# Patient Record
Sex: Male | Born: 1975 | Hispanic: Yes | Marital: Married | Smoking: Never smoker
Health system: Southern US, Community
[De-identification: ages and names within clinical notes are randomized; demographics above are authoritative.]

## PROBLEM LIST (undated history)

## (undated) DIAGNOSIS — R7303 Prediabetes: Secondary | ICD-10-CM

## (undated) DIAGNOSIS — Z789 Other specified health status: Secondary | ICD-10-CM

## (undated) HISTORY — PX: NO PAST SURGERIES: SHX2092

## (undated) HISTORY — PX: LIPOMA EXCISION: SHX5283

---

## 2005-04-02 ENCOUNTER — Ambulatory Visit: Payer: Self-pay | Admitting: Nurse Practitioner

## 2008-04-24 ENCOUNTER — Emergency Department: Payer: Self-pay | Admitting: Emergency Medicine

## 2009-05-06 HISTORY — PX: LIPOMA EXCISION: SHX5283

## 2013-05-31 ENCOUNTER — Ambulatory Visit: Payer: Self-pay | Admitting: Family Medicine

## 2013-06-07 ENCOUNTER — Ambulatory Visit: Payer: Self-pay | Admitting: Family Medicine

## 2016-06-26 ENCOUNTER — Ambulatory Visit
Admission: EM | Admit: 2016-06-26 | Discharge: 2016-06-26 | Disposition: A | Discharge status: Home / Self Care | Payer: Managed Care, Other (non HMO) | Attending: Family Medicine | Admitting: Family Medicine

## 2016-06-26 DIAGNOSIS — R69 Illness, unspecified: Secondary | ICD-10-CM | POA: Diagnosis not present

## 2016-06-26 DIAGNOSIS — J111 Influenza due to unidentified influenza virus with other respiratory manifestations: Secondary | ICD-10-CM

## 2016-06-26 MED ORDER — ALBUTEROL SULFATE HFA 108 (90 BASE) MCG/ACT IN AERS: 2.0000 | INHALATION_SPRAY | Freq: Four times a day (QID) | RESPIRATORY_TRACT | 0 refills | Status: DC | PRN

## 2016-06-26 MED ORDER — BENZONATATE 100 MG PO CAPS: 100.0000 mg | ORAL_CAPSULE | Freq: Three times a day (TID) | ORAL | 0 refills | Status: DC | PRN

## 2016-06-26 MED ORDER — PREDNISONE 20 MG PO TABS: 40.0000 mg | ORAL_TABLET | Freq: Every day | ORAL | 0 refills | Status: AC

## 2016-06-26 NOTE — Discharge Instructions (Signed)
Take medication as prescribed. Rest. Drink plenty of fluids.  ° °Follow up with your primary care physician this week as needed. Return to Urgent care for new or worsening concerns.  ° °

## 2016-06-26 NOTE — ED Triage Notes (Signed)
Patient complains of cough and congestion, no fevers. Patient reports that symptoms started over the weekend.

## 2016-06-26 NOTE — ED Provider Notes (Signed)
[facility] ____________________________________________  Time seen: Approximately 11:51 AM  I have reviewed the triage vital signs and the nursing notes.   HISTORY  Chief Complaint Cough   HPI Jon Decker is a 41 y.o. male  presents for the complaints of 3.5 days of runny nose, nasal congestion and cough. Patient reports the first 2 days he had accompanying body aches, possible fevers, denies known fevers and chills. Reports symptoms improved with over-the-counter cough and congestion medications as well as herbal tea. Patient reports has continued to remain active. Patient reports no longer feels like he has a fever.  Presented today as he has continued with intermittent cough and describes occasional wheezing with cough. Patient reports he was able to use vacation time for work earlier this week, but reports needs a work note nail as he no longer can use vacation time. Patient states that he does feel like he is improving and feeling better. States cough is a dry nonproductive cough. States nasal congestion has improved. Denies sore throat. Reports continues to eat and drink well. Denies urinary or bowel changes. Reports possible sick contacts at work, denies known sick contacts.  Denies chest pain, shortness of breath, abdominal pain, dysuria, extremity pain, extremity swelling or rash. Denies recent sickness. Denies recent antibiotic use.      History reviewed. No pertinent past medical history. Denies past medical history. Denies history of respiratory issues, wheezing or asthma.   There are no active problems to display for this patient.   Past Surgical History:  Procedure Laterality Date  . NO PAST SURGERIES       No current facility-administered medications for this encounter.   Current Outpatient Prescriptions:  .  albuterol (PROVENTIL HFA;VENTOLIN HFA) 108 (90 Base) MCG/ACT inhaler, Inhale 2 puffs into the lungs every 6 (six) hours as needed  for wheezing., Disp: 1 Inhaler, Rfl: 0 .  benzonatate (TESSALON PERLES) 100 MG capsule, Take 1 capsule (100 mg total) by mouth 3 (three) times daily as needed for cough., Disp: 15 capsule, Rfl: 0 .  predniSONE (DELTASONE) 20 MG tablet, Take 2 tablets (40 mg total) by mouth daily., Disp: 6 tablet, Rfl: 0  Allergies Patient has no known allergies.  History reviewed. No pertinent family history.  Social History Social History  Substance Use Topics  . Smoking status: Never Smoker  . Smokeless tobacco: Never Used  . Alcohol use No    Review of Systems Constitutional: As above.  Eyes: No visual changes. ENT: No sore throat. Cardiovascular: Denies chest pain. Respiratory: Denies shortness of breath. Gastrointestinal: No abdominal pain.  No nausea, no vomiting.  No diarrhea.  No constipation. Genitourinary: Negative for dysuria. Musculoskeletal: Negative for back pain. Skin: Negative for rash. Neurological: Negative for headaches, focal weakness or numbness.  10-point ROS otherwise negative.  ____________________________________________   PHYSICAL EXAM:  VITAL SIGNS: ED Triage Vitals  Enc Vitals Group     BP 06/26/16 1114 118/77     Pulse Rate 06/26/16 1114 87     Resp 06/26/16 1114 16     Temp 06/26/16 1114 98.5 F (36.9 C)     Temp Source 06/26/16 1114 Oral     SpO2 06/26/16 1114 99 %     Weight 06/26/16 1112 165 lb (74.8 kg)     Height --      Head Circumference --      Peak Flow --      Pain Score 06/26/16 1113 0     Pain Loc --  Pain Edu? --      Excl. in [place]? --    Constitutional: Alert and oriented. Well appearing and in no acute distress. Eyes: Conjunctivae are normal. PERRL. EOMI. Head: Atraumatic. No sinus tenderness to palpation. No swelling. No erythema.  Ears: no erythema, normal TMs bilaterally.   Nose: Mild nasal congestion and rhinorrhea.  Mouth/Throat: Mucous membranes are moist. No pharyngeal erythema. No tonsillar swelling or exudate.  Neck:  No stridor.  No cervical spine tenderness to palpation. Hematological/Lymphatic/Immunilogical: No cervical lymphadenopathy. Cardiovascular: Normal rate, regular rhythm. Grossly normal heart sounds.  Good peripheral circulation. Respiratory: Normal respiratory effort.  No retractions. No wheezes, rales or rhonchi. Good air movement.  Gastrointestinal: Soft and nontender. No CVA tenderness. Musculoskeletal: Ambulatory with steady gait. No cervical, thoracic or lumbar tenderness to palpation. Neurologic:  Normal speech and language. No gait instability. Skin:  Skin appears warm, dry and intact. No rash noted. Psychiatric: Mood and affect are normal. Speech and behavior are normal.   LABS (all labs ordered are listed, but only abnormal results are displayed)  Labs Reviewed - No data to display  PROCEDURES Procedures    INITIAL IMPRESSION / [place] / ED COURSE  Pertinent labs & imaging results that were available during my care of the patient were reviewed by me and considered in my medical decision making (see chart for details).  Well-appearing patient. No acute distress. Patient states that he is overall improving but continues with intermittent cough and needing work note. Lungs clear throughout, mild bronchospasm noted with cough and patient reports intermittent wheezing at home. No other wheezing heard. No rhonchi or focal areas of consolidation. As 3-4 days of symptoms and based on patient's description, suspect patient with post influenza symptoms. Discussed with patient no clear indication for antibiotic use at this time. Will treat patient with oral prednisone, when necessary albuterol inhaler and when necessary Tessalon Perles. Encouraged rest, fluids and supportive care. Work note given for today and tomorrow. Discussed strict follow-up and return parameters for any nonimprovement or worsening concerns. Discussed indication, risks and benefits of medications with  patient.  Discussed follow up with Primary care physician this week. Discussed follow up and return parameters including no resolution or any worsening concerns. Patient verbalized understanding and agreed to plan.   ____________________________________________   FINAL CLINICAL IMPRESSION(S) / ED DIAGNOSES  Final diagnoses:  Influenza-like illness     Discharge Medication List as of 06/26/2016 12:09 PM    START taking these medications   Details  albuterol (PROVENTIL HFA;VENTOLIN HFA) 108 (90 Base) MCG/ACT inhaler Inhale 2 puffs into the lungs every 6 (six) hours as needed for wheezing., Starting Wed 06/26/2016, Normal    benzonatate (TESSALON PERLES) 100 MG capsule Take 1 capsule (100 mg total) by mouth 3 (three) times daily as needed for cough., Starting Wed 06/26/2016, Normal    predniSONE (DELTASONE) 20 MG tablet Take 2 tablets (40 mg total) by mouth daily., Starting Wed 06/26/2016, Until Sat 06/29/2016, Normal        Note: This dictation was prepared with Dragon dictation along with smaller phrase technology. Any transcriptional errors that result from this process are unintentional.         Marylene Land, NP 06/26/16 1429

## 2018-04-24 ENCOUNTER — Other Ambulatory Visit: Payer: Self-pay

## 2018-04-24 ENCOUNTER — Emergency Department: Payer: Managed Care, Other (non HMO)

## 2018-04-24 DIAGNOSIS — Y9389 Activity, other specified: Secondary | ICD-10-CM | POA: Insufficient documentation

## 2018-04-24 DIAGNOSIS — S20211A Contusion of right front wall of thorax, initial encounter: Secondary | ICD-10-CM | POA: Diagnosis not present

## 2018-04-24 DIAGNOSIS — Y92414 Local residential or business street as the place of occurrence of the external cause: Secondary | ICD-10-CM | POA: Insufficient documentation

## 2018-04-24 DIAGNOSIS — Y999 Unspecified external cause status: Secondary | ICD-10-CM | POA: Diagnosis not present

## 2018-04-24 DIAGNOSIS — M25561 Pain in right knee: Secondary | ICD-10-CM | POA: Diagnosis not present

## 2018-04-24 DIAGNOSIS — S299XXA Unspecified injury of thorax, initial encounter: Secondary | ICD-10-CM | POA: Diagnosis present
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
## 2018-04-25 ENCOUNTER — Emergency Department
Admission: EM | Admit: 2018-04-25 | Discharge: 2018-04-25 | Disposition: A | Discharge status: Home / Self Care | Payer: Managed Care, Other (non HMO) | Attending: Emergency Medicine | Admitting: Emergency Medicine

## 2018-04-25 DIAGNOSIS — S298XXA Other specified injuries of thorax, initial encounter: Secondary | ICD-10-CM

## 2018-04-25 DIAGNOSIS — S20211A Contusion of right front wall of thorax, initial encounter: Secondary | ICD-10-CM

## 2018-04-25 MED ORDER — LIDOCAINE 5 % EX PTCH: 1.0000 | MEDICATED_PATCH | Freq: Two times a day (BID) | CUTANEOUS | 0 refills | Status: AC

## 2018-04-25 MED ORDER — HYDROCODONE-ACETAMINOPHEN 5-325 MG PO TABS
2.0000 | ORAL_TABLET | Freq: Once | ORAL | Status: AC
  Administered 2018-04-25: 2 via ORAL
  Filled 2018-04-25: qty 2

## 2018-04-25 MED ORDER — HYDROCODONE-ACETAMINOPHEN 5-325 MG PO TABS: 1.0000 | ORAL_TABLET | Freq: Four times a day (QID) | ORAL | 0 refills | Status: DC | PRN

## 2018-04-25 MED ORDER — IBUPROFEN 600 MG PO TABS: 600.0000 mg | ORAL_TABLET | Freq: Three times a day (TID) | ORAL | 0 refills | Status: DC | PRN

## 2018-04-25 MED ORDER — IBUPROFEN 600 MG PO TABS
600.0000 mg | ORAL_TABLET | Freq: Once | ORAL | Status: AC
  Administered 2018-04-25: 600 mg via ORAL
  Filled 2018-04-25: qty 1

## 2018-04-25 MED ORDER — LIDOCAINE 5 % EX PTCH
1.0000 | MEDICATED_PATCH | Freq: Once | CUTANEOUS | Status: DC
  Administered 2018-04-25: 1 via TRANSDERMAL
  Filled 2018-04-25: qty 1
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
## 2019-01-29 ENCOUNTER — Other Ambulatory Visit: Payer: Self-pay

## 2019-01-29 DIAGNOSIS — Z20822 Contact with and (suspected) exposure to covid-19: Secondary | ICD-10-CM

## 2019-01-30 LAB — NOVEL CORONAVIRUS, NAA: SARS-CoV-2, NAA: NOT DETECTED

## 2019-02-01 ENCOUNTER — Telehealth: Payer: Self-pay | Admitting: General Practice

## 2019-02-01 NOTE — Telephone Encounter (Signed)
Negative COVID results given. Patient results "NOT Detected." Caller expressed understanding. ° °

## 2019-05-11 ENCOUNTER — Ambulatory Visit: Payer: Managed Care, Other (non HMO) | Attending: Internal Medicine

## 2019-05-11 DIAGNOSIS — Z20822 Contact with and (suspected) exposure to covid-19: Secondary | ICD-10-CM

## 2019-05-13 LAB — NOVEL CORONAVIRUS, NAA: SARS-CoV-2, NAA: NOT DETECTED

## 2019-08-07 ENCOUNTER — Ambulatory Visit: Payer: Self-pay | Attending: Internal Medicine

## 2019-08-07 DIAGNOSIS — Z23 Encounter for immunization: Secondary | ICD-10-CM

## 2019-08-07 NOTE — Progress Notes (Signed)
   Covid-19 Vaccination Clinic  Name:  Jon Decker    MRN: [id number] DOB: 10/24/1975  08/07/2019  Mr. Jon Decker was observed post Covid-19 immunization for 15 minutes without incident. He was provided with Vaccine Information Sheet and instruction to access the V-Safe system.   Mr. Jon Decker was instructed to call 911 with any severe reactions post vaccine: Marland Kitchen Difficulty breathing  . Swelling of face and throat  . A fast heartbeat  . A bad rash all over body  . Dizziness and weakness   Immunizations Administered    Name Date Dose VIS Date Route   Pfizer COVID-19 Vaccine 08/07/2019  9:23 AM 0.3 mL 04/16/2019 Intramuscular   Manufacturer: [place]   Lot: [id number]   [place]: [id number]

## 2019-08-28 ENCOUNTER — Ambulatory Visit: Payer: Self-pay | Attending: Internal Medicine

## 2019-08-28 DIAGNOSIS — Z23 Encounter for immunization: Secondary | ICD-10-CM

## 2019-08-28 NOTE — Progress Notes (Signed)
   Covid-19 Vaccination Clinic  Name:  Jon Decker    MRN: [id number] DOB: June 19, 1975  08/28/2019  Mr. Jon Decker was observed post Covid-19 immunization for 15 minutes without incident. He was provided with Vaccine Information Sheet and instruction to access the V-Safe system.   Mr. Jon Decker was instructed to call 911 with any severe reactions post vaccine: Marland Kitchen Difficulty breathing  . Swelling of face and throat  . A fast heartbeat  . A bad rash all over body  . Dizziness and weakness   Immunizations Administered    Name Date Dose VIS Date Route   Pfizer COVID-19 Vaccine 08/28/2019  9:12 AM 0.3 mL 06/30/2018 Intramuscular   Manufacturer: [employer]   Lot: [id number]   [place]: [id number]
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
## 2020-08-28 ENCOUNTER — Encounter: Payer: Self-pay | Admitting: *Deleted

## 2020-08-28 ENCOUNTER — Ambulatory Visit
Admission: RE | Admit: 2020-08-28 | Discharge: 2020-08-28 | Disposition: A | Discharge status: Home / Self Care | Payer: No Typology Code available for payment source | Attending: Gastroenterology | Admitting: Gastroenterology

## 2020-08-28 ENCOUNTER — Other Ambulatory Visit: Payer: Self-pay

## 2020-08-28 ENCOUNTER — Ambulatory Visit: Payer: No Typology Code available for payment source | Admitting: Anesthesiology

## 2020-08-28 ENCOUNTER — Encounter
Admission: RE | Disposition: A | Discharge status: Home / Self Care | Payer: Self-pay | Source: Home / Self Care | Attending: Gastroenterology

## 2020-08-28 DIAGNOSIS — D120 Benign neoplasm of cecum: Secondary | ICD-10-CM | POA: Insufficient documentation

## 2020-08-28 DIAGNOSIS — Z79899 Other long term (current) drug therapy: Secondary | ICD-10-CM | POA: Insufficient documentation

## 2020-08-28 DIAGNOSIS — Z791 Long term (current) use of non-steroidal anti-inflammatories (NSAID): Secondary | ICD-10-CM | POA: Diagnosis not present

## 2020-08-28 DIAGNOSIS — K640 First degree hemorrhoids: Secondary | ICD-10-CM | POA: Insufficient documentation

## 2020-08-28 DIAGNOSIS — Z1211 Encounter for screening for malignant neoplasm of colon: Secondary | ICD-10-CM | POA: Diagnosis present

## 2020-08-28 DIAGNOSIS — D123 Benign neoplasm of transverse colon: Secondary | ICD-10-CM | POA: Diagnosis not present

## 2020-08-28 HISTORY — DX: Other specified health status: Z78.9

## 2020-08-28 HISTORY — PX: COLONOSCOPY WITH PROPOFOL: SHX5780

## 2020-08-28 SURGERY — COLONOSCOPY WITH PROPOFOL
Anesthesia: General

## 2020-08-28 MED ORDER — PROPOFOL 500 MG/50ML IV EMUL
INTRAVENOUS | Status: DC | PRN
  Administered 2020-08-28: 175 ug/kg/min via INTRAVENOUS

## 2020-08-28 MED ORDER — PROPOFOL 500 MG/50ML IV EMUL
INTRAVENOUS | Status: AC
  Filled 2020-08-28: qty 50

## 2020-08-28 MED ORDER — PROPOFOL 10 MG/ML IV BOLUS
INTRAVENOUS | Status: DC | PRN
  Administered 2020-08-28: 80 mg via INTRAVENOUS
  Administered 2020-08-28: 40 mg via INTRAVENOUS

## 2020-08-28 MED ORDER — LIDOCAINE HCL (CARDIAC) PF 100 MG/5ML IV SOSY
PREFILLED_SYRINGE | INTRAVENOUS | Status: DC | PRN
  Administered 2020-08-28: 50 mg via INTRAVENOUS

## 2020-08-28 MED ORDER — PROPOFOL 10 MG/ML IV BOLUS
INTRAVENOUS | Status: AC
  Filled 2020-08-28: qty 20

## 2020-08-28 MED ORDER — SODIUM CHLORIDE 0.9 % IV SOLN: INTRAVENOUS | Status: DC
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
## 2020-08-28 NOTE — Transfer of Care (Signed)
Immediate Anesthesia Transfer of Care Note  Patient: Jon Decker  Procedure(s) Performed: COLONOSCOPY WITH PROPOFOL (N/A )  Patient Location: PACU  Anesthesia Type:General  Level of Consciousness: drowsy  Airway & Oxygen Therapy: Patient Spontanous Breathing  Post-op Assessment: Report given to RN and Post -op Vital signs reviewed and stable  Post vital signs: Reviewed and stable  Last Vitals:  Vitals Value Taken Time  BP    Temp    Pulse 85 08/28/20 1108  Resp 17 08/28/20 1108  SpO2 94 % 08/28/20 1108    Last Pain:  Vitals:   08/28/20 0947  TempSrc: Temporal  PainSc: 0-No pain         Complications: No complications documented.

## 2020-08-28 NOTE — Anesthesia Preprocedure Evaluation (Signed)
Anesthesia Evaluation  Patient identified by MRN, date of birth, ID band Patient awake    Reviewed: Allergy & Precautions, NPO status , Patient's Chart, lab work & pertinent test results  Airway Mallampati: II  TM Distance: >3 FB     Dental   Pulmonary asthma ,    Pulmonary exam normal        Cardiovascular negative cardio ROS       Neuro/Psych negative neurological ROS  negative psych ROS   GI/Hepatic negative GI ROS, Neg liver ROS,   Endo/Other  negative endocrine ROS  Renal/GU negative Renal ROS  negative genitourinary   Musculoskeletal negative musculoskeletal ROS (+)   Abdominal   Peds negative pediatric ROS (+)  Hematology negative hematology ROS (+)   Anesthesia Other Findings Past Medical History: No date: Medical history non-contributory  Reproductive/Obstetrics                             Anesthesia Physical Anesthesia Plan  ASA: II  Anesthesia Plan: General   Post-op Pain Management:    Induction: Intravenous  PONV Risk Score and Plan: Propofol infusion  Airway Management Planned: Nasal Cannula  Additional Equipment:   Intra-op Plan:   Post-operative Plan:   Informed Consent: I have reviewed the patients History and Physical, chart, labs and discussed the procedure including the risks, benefits and alternatives for the proposed anesthesia with the patient or authorized representative who has indicated his/her understanding and acceptance.     Dental advisory given  Plan Discussed with: CRNA and Surgeon  Anesthesia Plan Comments:         Anesthesia Quick Evaluation

## 2020-08-28 NOTE — Anesthesia Procedure Notes (Signed)
Date/Time: 08/28/2020 10:32 AM Performed by: Johnna Acosta, CRNA Pre-anesthesia Checklist: Patient identified, Emergency Drugs available, Suction available, Patient being monitored and Timeout performed Patient Re-evaluated:Patient Re-evaluated prior to induction Oxygen Delivery Method: Nasal cannula Preoxygenation: Pre-oxygenation with 100% oxygen Induction Type: IV induction

## 2020-08-28 NOTE — Op Note (Signed)
[facility] Gastroenterology Patient Name: Jon Decker Procedure Date: 08/28/2020 10:29 AM MRN: [id number] Account #: [id number] Date of Birth: June 05, 1975 Admit Type: Outpatient Age: 45 Room: [facility] ENDO ROOM 3 Gender: Male Note Status: Finalized Procedure:             Colonoscopy Indications:           Screening for colorectal malignant neoplasm Providers:             Andrey Farmer MD, MD Referring MD:          Dion Body (Referring MD) Medicines:             Monitored Anesthesia Care Complications:         No immediate complications. Estimated blood loss:                         Minimal. Procedure:             Pre-Anesthesia Assessment:                        - Prior to the procedure, a History and Physical was                         performed, and patient medications and allergies were                         reviewed. The patient is competent. The risks and                         benefits of the procedure and the sedation options and                         risks were discussed with the patient. All questions                         were answered and informed consent was obtained.                         Patient identification and proposed procedure were                         verified by the physician, the nurse, the anesthetist                         and the technician in the endoscopy suite. Mental                         Status Examination: alert and oriented. Airway                         Examination: normal oropharyngeal airway and neck                         mobility. Respiratory Examination: clear to                         auscultation. CV Examination: normal. Prophylactic  Antibiotics: The patient does not require prophylactic                         antibiotics. Prior Anticoagulants: The patient has                         taken no previous anticoagulant or antiplatelet                         agents.  ASA Grade Assessment: II - A patient with mild                         systemic disease. After reviewing the risks and                         benefits, the patient was deemed in satisfactory                         condition to undergo the procedure. The anesthesia                         plan was to use monitored anesthesia care (MAC).                         Immediately prior to administration of medications,                         the patient was re-assessed for adequacy to receive                         sedatives. The heart rate, respiratory rate, oxygen                         saturations, blood pressure, adequacy of pulmonary                         ventilation, and response to care were monitored                         throughout the procedure. The physical status of the                         patient was re-assessed after the procedure.                        After obtaining informed consent, the colonoscope was                         passed under direct vision. Throughout the procedure,                         the patient's blood pressure, pulse, and oxygen                         saturations were monitored continuously. The                         Colonoscope was introduced through the anus and  advanced to the the cecum, identified by appendiceal                         orifice and ileocecal valve. The colonoscopy was                         performed without difficulty. The patient tolerated                         the procedure well. The quality of the bowel                         preparation was good. Findings:      The perianal and digital rectal examinations were normal.      Two pedunculated polyps were found in the cecum. The polyps were 10 to       13 mm in size. These polyps were removed with a hot snare. Resection and       retrieval were complete. Estimated blood loss was minimal.      A 3 mm polyp was found in the cecum. The polyp was  sessile. The polyp       was removed with a cold snare. Resection and retrieval were complete.       Estimated blood loss was minimal.      A 7 mm polyp was found in the hepatic flexure. The polyp was       pedunculated. The polyp was removed with a cold snare. Resection and       retrieval were complete. Estimated blood loss was minimal.      Three sessile polyps were found in the transverse colon. The polyps were       3 to 5 mm in size. These polyps were removed with a cold snare.       Resection and retrieval were complete. Estimated blood loss was minimal.      Internal hemorrhoids were found during retroflexion. The hemorrhoids       were Grade I (internal hemorrhoids that do not prolapse).      The exam was otherwise without abnormality on direct and retroflexion       views. Impression:            - Two 10 to 13 mm polyps in the cecum, removed with a                         hot snare. Resected and retrieved.                        - One 3 mm polyp in the cecum, removed with a cold                         snare. Resected and retrieved.                        - One 7 mm polyp at the hepatic flexure, removed with                         a cold snare. Resected and retrieved.                        -  Three 3 to 5 mm polyps in the transverse colon,                         removed with a cold snare. Resected and retrieved.                        - Internal hemorrhoids.                        - The examination was otherwise normal on direct and                         retroflexion views. Recommendation:        - Discharge patient to home.                        - Resume previous diet.                        - Continue present medications.                        - Await pathology results.                        - Repeat colonoscopy in 3 years for surveillance.                        - Return to referring physician as previously                         scheduled. Procedure Code(s):      --- Professional ---                        [phone], Colonoscopy, flexible; with removal of                         tumor(s), polyp(s), or other lesion(s) by snare                         technique Diagnosis Code(s):     --- Professional ---                        K63.5, Polyp of colon                        Z12.11, Encounter for screening for malignant neoplasm                         of colon                        K64.0, First degree hemorrhoids CPT copyright 2019 [employer]. All rights reserved. The codes documented in this report are preliminary and upon coder review may  be revised to meet current compliance requirements. Andrey Farmer MD, MD 08/28/2020 11:06:20 AM Number of Addenda: 0 Note Initiated On: 08/28/2020 10:29 AM Scope Withdrawal Time: 0 hours 18 minutes 36 seconds  Total Procedure Duration: 0 hours 24 minutes 11 seconds  Estimated Blood Loss:  Estimated blood loss was minimal.  [facility]

## 2020-08-28 NOTE — Anesthesia Postprocedure Evaluation (Signed)
Anesthesia Post Note  Patient: Jon Decker  Procedure(s) Performed: COLONOSCOPY WITH PROPOFOL (N/A )  Patient location during evaluation: Endoscopy Anesthesia Type: General Level of consciousness: awake and alert and oriented Pain management: pain level controlled Vital Signs Assessment: post-procedure vital signs reviewed and stable Respiratory status: spontaneous breathing Cardiovascular status: blood pressure returned to baseline Anesthetic complications: no   No complications documented.   Last Vitals:  Vitals:   08/28/20 1107 08/28/20 1109  BP: 96/68 98/61  Pulse:    Resp:    Temp: (!) 36.1 C   SpO2:      Last Pain:  Vitals:   08/28/20 1128  TempSrc:   PainSc: 0-No pain                 Velisa Regnier

## 2020-08-28 NOTE — H&P (Signed)
Outpatient short stay form Pre-procedure 08/28/2020 10:23 AM Raylene Miyamoto MD, MPH  Primary Physician: Dr. Netty Starring  Reason for visit:  Screening colonoscopy  History of present illness:   45 y/o gentleman with no significant history here for screening colonoscopy. No family history of GI malignancies. No blood thinners. No abdominal surgeries. No new symptoms.    Current Facility-Administered Medications:  .  0.9 %  sodium chloride infusion, , Intravenous, Continuous, Kiaan Overholser, Hilton Cork, MD, Last Rate: 20 mL/hr at 08/28/20 1022, Continued from Pre-op at 08/28/20 1022  Medications Prior to Admission  Medication Sig Dispense Refill Last Dose  . ibuprofen (ADVIL,MOTRIN) 600 MG tablet Take 1 tablet (600 mg total) by mouth every 8 (eight) hours as needed. 30 tablet 0 Past Month at Unknown time  . albuterol (PROVENTIL HFA;VENTOLIN HFA) 108 (90 Base) MCG/ACT inhaler Inhale 2 puffs into the lungs every 6 (six) hours as needed for wheezing. (Patient not taking: Reported on 08/28/2020) 1 Inhaler 0 Not Taking at Unknown time  . benzonatate (TESSALON PERLES) 100 MG capsule Take 1 capsule (100 mg total) by mouth 3 (three) times daily as needed for cough. (Patient not taking: Reported on 08/28/2020) 15 capsule 0 Not Taking at Unknown time  . HYDROcodone-acetaminophen (NORCO) 5-325 MG tablet Take 1 tablet by mouth every 6 (six) hours as needed for up to 7 doses for severe pain. 7 tablet 0      No Known Allergies   Past Medical History:  Diagnosis Date  . Medical history non-contributory     Review of systems:  Otherwise negative.    Physical Exam  Gen: Alert, oriented. Appears stated age.  HEENT: PERRLA. Lungs: No respiratory distress CV: RRR Abd: soft, benign, no masses Ext: No edema    Planned procedures: Proceed with colonoscopy. The patient understands the nature of the planned procedure, indications, risks, alternatives and potential complications including but not limited to  bleeding, infection, perforation, damage to internal organs and possible oversedation/side effects from anesthesia. The patient agrees and gives consent to proceed.  Please refer to procedure notes for findings, recommendations and patient disposition/instructions.     Raylene Miyamoto MD, MPH Gastroenterology 08/28/2020  10:23 AM

## 2020-08-29 LAB — SURGICAL PATHOLOGY

## 2020-08-30 ENCOUNTER — Encounter: Payer: Self-pay | Admitting: Gastroenterology

## 2022-10-15 ENCOUNTER — Ambulatory Visit: Payer: Self-pay | Admitting: General Surgery

## 2022-10-15 NOTE — H&P (Signed)
PATIENT PROFILE: Jon Decker is a 46 y.o. male who presents to the Clinic for consultation at the request of [place], [place] for evaluation of right groin pain.  PCP:  Linthavong, Kanhka, MD  HISTORY OF PRESENT ILLNESS: [name] reports he has been having regular pain for the last few weeks.  The pain localized to the right groin.  Pain radiates to the right testicle.  Pain is aggravated by applying pressure elevating factor is resting.  Minimal pain in the left groin.  He denies any episode of abdominal distention, nausea or vomiting.   PROBLEM LIST: Problem List  Date Reviewed: 02/20/2022          Noted   Pure hypercholesterolemia (LDL 135 - 02/20/22) - diet controlled 02/21/2022   Borderline diabetes mellitus (A1c 5.9% - 02/20/22) - diet controlled 02/20/2021   Class 2 obesity due to excess calories without serious comorbidity with body mass index (BMI) of 35.0 to 35.9 in adult 10/13/2017   History of herpes genitalis 02/09/2016   History of reactive airway disease 12/13/2015    GENERAL REVIEW OF SYSTEMS:   General ROS: negative for - chills, fatigue, fever, weight gain or weight loss Allergy and Immunology ROS: negative for - hives  Hematological and Lymphatic ROS: negative for - bleeding problems or bruising, negative for palpable nodes Endocrine ROS: negative for - heat or cold intolerance, hair changes Respiratory ROS: negative for - cough, shortness of breath or wheezing Cardiovascular ROS: no chest pain or palpitations GI ROS: negative for nausea, vomiting, abdominal pain, diarrhea, constipation Musculoskeletal ROS: negative for - joint swelling or muscle pain Neurological ROS: negative for - confusion, syncope Dermatological ROS: negative for pruritus and rash Psychiatric: negative for anxiety, depression, difficulty sleeping and memory loss  MEDICATIONS: Current Outpatient Medications  Medication Sig Dispense Refill   albuterol 90 mcg/actuation inhaler Inhale 2 inhalations  into the lungs every 4 (four) hours as needed for Wheezing or Shortness of Breath (Patient not taking: Reported on 10/15/2022) 1 each 1   No current facility-administered medications for this visit.    ALLERGIES: Patient has no known allergies.  PAST MEDICAL HISTORY: Past Medical History:  Diagnosis Date   Herpes simplex type 2 infection     PAST SURGICAL HISTORY: Past Surgical History:  Procedure Laterality Date   COLONOSCOPY  08/28/2020   Tubular adenomas/Repeat 3yrs/CTL     FAMILY HISTORY: Family History  Problem Relation Name Age of Onset   Ulcers Brother       SOCIAL HISTORY: Social History   Socioeconomic History   Marital status: Married  Tobacco Use   Smoking status: Never   Smokeless tobacco: Never  Vaping Use   Vaping status: Never Used  Substance and Sexual Activity   Alcohol use: Yes    Comment: Occasional- beer   Drug use: No   Sexual activity: Defer    PHYSICAL EXAM: Vitals:   10/15/22 1005  BP: 116/74  Pulse: 83   Body mass index is 35.57 kg/m. Weight: 91.1 kg (200 lb 12.8 oz)   GENERAL: Alert, active, oriented x3  HEENT: Pupils equal reactive to light. Extraocular movements are intact. Sclera clear. Palpebral conjunctiva normal red color.Pharynx clear.  NECK: Supple with no palpable mass and no adenopathy.  LUNGS: Sound clear with no rales rhonchi or wheezes.  HEART: Regular rhythm S1 and S2 without murmur.  ABDOMEN: Soft and depressible, nontender with no palpable mass, no hepatomegaly.  Right palpable inguinal hernia  EXTREMITIES: Well-developed well-nourished symmetrical with no dependent   edema.  NEUROLOGICAL: Awake alert oriented, facial expression symmetrical, moving all extremities.  REVIEW OF DATA: I have reviewed the following data today: No visits with results within 3 Month(s) from this visit.  Latest known visit with results is:  Office Visit on 02/20/2022  Component Date Value   WBC (White Blood Cell Co* 02/20/2022  8.7    RBC (Red Blood Cell Coun* 02/20/2022 6.00    Hemoglobin 02/20/2022 14.4    Hematocrit 02/20/2022 46.2    MCV (Mean Corpuscular Vo* 02/20/2022 77.0 (L)    MCH (Mean Corpuscular He* 02/20/2022 24.0 (L)    MCHC (Mean Corpuscular H* 02/20/2022 31.2 (L)    Platelet Count 02/20/2022 282    RDW-CV (Red Cell Distrib* 02/20/2022 15.9 (H)    MPV (Mean Platelet Volum* 02/20/2022 10.0    Neutrophils 02/20/2022 3.84    Lymphocytes 02/20/2022 3.81 (H)    Monocytes 02/20/2022 0.64    Eosinophils 02/20/2022 0.33    Basophils 02/20/2022 0.08    Neutrophil % 02/20/2022 44.2    Lymphocyte % 02/20/2022 43.7    Monocyte % 02/20/2022 7.3    Eosinophil % 02/20/2022 3.8    Basophil% 02/20/2022 0.9    Immature Granulocyte % 02/20/2022 0.1    Immature Granulocyte Cou* 02/20/2022 0.01    Glucose 02/20/2022 85    Sodium 02/20/2022 140    Potassium 02/20/2022 4.2    Chloride 02/20/2022 106    Carbon Dioxide (CO2) 02/20/2022 30.7    Urea Nitrogen (BUN) 02/20/2022 16    Creatinine 02/20/2022 0.8    Glomerular Filtration Ra* 02/20/2022 110    Calcium 02/20/2022 8.8    AST  02/20/2022 27    ALT  02/20/2022 33    Alk Phos (alkaline Phosp* 02/20/2022 117 (H)    Albumin 02/20/2022 4.2    Bilirubin, Total 02/20/2022 0.3    Protein, Total 02/20/2022 6.6    A/G Ratio 02/20/2022 1.8    Hemoglobin A1C 02/20/2022 5.9 (H)    Average Blood Glucose (C* 02/20/2022 123    Cholesterol, Total 02/20/2022 209 (H)    Triglyceride 02/20/2022 182    HDL (High Density Lipopr* 02/20/2022 37.5    LDL Calculated 02/20/2022 135 (H)    VLDL Cholesterol 02/20/2022 36    Cholesterol/HDL Ratio 02/20/2022 5.6      ASSESSMENT: [name] is a 46 y.o. male presenting for consultation for right versus bilateral inguinal hernia.    The patient presents with a symptomatic, reducible right versus bilateral inguinal hernia. Patient was oriented about the diagnosis of inguinal hernia and its implication. The patient was oriented  about the treatment alternatives (observation vs surgical repair). Due to patient symptoms, repair is recommended. Patient oriented about the surgical procedure, the use of mesh and its risk of complications such as: infection, bleeding, injury to vas deference, vasculature and testicle, injury to bowel or bladder, and chronic pain.   Non-recurrent unilateral inguinal hernia without obstruction or gangrene [K40.90]  PLAN: 1.  Robotic assisted laparoscopic right vs bilateral inguinal hernia repair with mesh ([id number]) 2.  Avoid taking aspirin 5 days before procedure 3.  Contact us if has any question or concern.   Patient verbalized understanding, all questions were answered, and were agreeable with the plan outlined above.    Makai Agostinelli Cintron-Diaz, MD  Electronically signed by Deonna Krummel Cintron-Diaz, MD  

## 2022-10-15 NOTE — H&P (View-Only) (Signed)
PATIENT PROFILE: Jon Decker is a 47 y.o. male who presents to the Clinic for consultation at the request of Tume[place], [place] for evaluation of right groin pain.  PCP:  Marisue Ivan, MD  HISTORY OF PRESENT ILLNESS: Jon Decker reports he has been having regular pain for the last few weeks.  The pain localized to the right groin.  Pain radiates to the right testicle.  Pain is aggravated by applying pressure elevating factor is resting.  Minimal pain in the left groin.  He denies any episode of abdominal distention, nausea or vomiting.   PROBLEM LIST: Problem List  Date Reviewed: 02/20/2022          Noted   Pure hypercholesterolemia (LDL 135 - 02/20/22) - diet controlled 02/21/2022   Borderline diabetes mellitus (A1c 5.9% - 02/20/22) - diet controlled 02/20/2021   Class 2 obesity due to excess calories without serious comorbidity with body mass index (BMI) of 35.0 to 35.9 in adult 10/13/2017   History of herpes genitalis 02/09/2016   History of reactive airway disease 12/13/2015    GENERAL REVIEW OF SYSTEMS:   General ROS: negative for - chills, fatigue, fever, weight gain or weight loss Allergy and Immunology ROS: negative for - hives  Hematological and Lymphatic ROS: negative for - bleeding problems or bruising, negative for palpable nodes Endocrine ROS: negative for - heat or cold intolerance, hair changes Respiratory ROS: negative for - cough, shortness of breath or wheezing Cardiovascular ROS: no chest pain or palpitations GI ROS: negative for nausea, vomiting, abdominal pain, diarrhea, constipation Musculoskeletal ROS: negative for - joint swelling or muscle pain Neurological ROS: negative for - confusion, syncope Dermatological ROS: negative for pruritus and rash Psychiatric: negative for anxiety, depression, difficulty sleeping and memory loss  MEDICATIONS: Current Outpatient Medications  Medication Sig Dispense Refill   albuterol 90 mcg/actuation inhaler Inhale 2 inhalations  into the lungs every 4 (four) hours as needed for Wheezing or Shortness of Breath (Patient not taking: Reported on 10/15/2022) 1 each 1   No current facility-administered medications for this visit.    ALLERGIES: Patient has no known allergies.  PAST MEDICAL HISTORY: Past Medical History:  Diagnosis Date   Herpes simplex type 2 infection     PAST SURGICAL HISTORY: Past Surgical History:  Procedure Laterality Date   COLONOSCOPY  08/28/2020   Tubular adenomas/Repeat 45yrs/CTL     FAMILY HISTORY: Family History  Problem Relation Name Age of Onset   Ulcers Brother       SOCIAL HISTORY: Social History   Socioeconomic History   Marital status: Married  Tobacco Use   Smoking status: Never   Smokeless tobacco: Never  Vaping Use   Vaping status: Never Used  Substance and Sexual Activity   Alcohol use: Yes    Comment: Occasional- beer   Drug use: No   Sexual activity: Defer    PHYSICAL EXAM: Vitals:   10/15/22 1005  BP: 116/74  Pulse: 83   Body mass index is 35.57 kg/m. Weight: 91.1 kg (200 lb 12.8 oz)   GENERAL: Alert, active, oriented x3  HEENT: Pupils equal reactive to light. Extraocular movements are intact. Sclera clear. Palpebral conjunctiva normal red color.Pharynx clear.  NECK: Supple with no palpable mass and no adenopathy.  LUNGS: Sound clear with no rales rhonchi or wheezes.  HEART: Regular rhythm S1 and S2 without murmur.  ABDOMEN: Soft and depressible, nontender with no palpable mass, no hepatomegaly.  Right palpable inguinal hernia  EXTREMITIES: Well-developed well-nourished symmetrical with no dependent  edema.  NEUROLOGICAL: Awake alert oriented, facial expression symmetrical, moving all extremities.  REVIEW OF DATA: I have reviewed the following data today: No visits with results within 3 Month(s) from this visit.  Latest known visit with results is:  Office Visit on 02/20/2022  Component Date Value   WBC (White Blood Cell Co* 02/20/2022  8.7    RBC (Red Blood Cell Coun* 02/20/2022 6.00    Hemoglobin 02/20/2022 14.4    Hematocrit 02/20/2022 46.2    MCV (Mean Corpuscular Vo* 02/20/2022 77.0 (L)    MCH (Mean Corpuscular He* 02/20/2022 24.0 (L)    MCHC (Mean Corpuscular H* 02/20/2022 31.2 (L)    Platelet Count 02/20/2022 282    RDW-CV (Red Cell Distrib* 02/20/2022 15.9 (H)    MPV (Mean Platelet Volum* 02/20/2022 10.0    Neutrophils 02/20/2022 3.84    Lymphocytes 02/20/2022 3.81 (H)    Monocytes 02/20/2022 0.64    Eosinophils 02/20/2022 0.33    Basophils 02/20/2022 0.08    Neutrophil % 02/20/2022 44.2    Lymphocyte % 02/20/2022 43.7    Monocyte % 02/20/2022 7.3    Eosinophil % 02/20/2022 3.8    Basophil% 02/20/2022 0.9    Immature Granulocyte % 02/20/2022 0.1    Immature Granulocyte Cou* 02/20/2022 0.01    Glucose 02/20/2022 85    Sodium 02/20/2022 140    Potassium 02/20/2022 4.2    Chloride 02/20/2022 106    Carbon Dioxide (CO2) 02/20/2022 30.7    Urea Nitrogen (BUN) 02/20/2022 16    Creatinine 02/20/2022 0.8    Glomerular Filtration Ra* 02/20/2022 110    Calcium 02/20/2022 8.8    AST  02/20/2022 27    ALT  02/20/2022 33    Alk Phos (alkaline Phosp* 02/20/2022 117 (H)    Albumin 02/20/2022 4.2    Bilirubin, Total 02/20/2022 0.3    Protein, Total 02/20/2022 6.6    A/G Ratio 02/20/2022 1.8    Hemoglobin A1C 02/20/2022 5.9 (H)    Average Blood Glucose (C* 02/20/2022 123    Cholesterol, Total 02/20/2022 209 (H)    Triglyceride 02/20/2022 182    HDL (High Density Lipopr* 16/02/9603 37.5    LDL Calculated 02/20/2022 540 (H)    VLDL Cholesterol 02/20/2022 36    Cholesterol/HDL Ratio 02/20/2022 5.6      ASSESSMENT: Jon Decker is a 47 y.o. male presenting for consultation for right versus bilateral inguinal hernia.    The patient presents with a symptomatic, reducible right versus bilateral inguinal hernia. Patient was oriented about the diagnosis of inguinal hernia and its implication. The patient was oriented  about the treatment alternatives (observation vs surgical repair). Due to patient symptoms, repair is recommended. Patient oriented about the surgical procedure, the use of mesh and its risk of complications such as: infection, bleeding, injury to vas deference, vasculature and testicle, injury to bowel or bladder, and chronic pain.   Non-recurrent unilateral inguinal hernia without obstruction or gangrene [K40.90]  PLAN: 1.  Robotic assisted laparoscopic right vs bilateral inguinal hernia repair with mesh ([id number]) 2.  Avoid taking aspirin 5 days before procedure 3.  Contact us if has any question or concern.   Patient verbalized understanding, all questions were answered, and were agreeable with the plan outlined above.    Carolan Shiver, MD  Electronically signed by Carolan Shiver, MD

## 2022-10-18 ENCOUNTER — Encounter
Admission: RE | Admit: 2022-10-18 | Discharge: 2022-10-18 | Disposition: A | Discharge status: Home / Self Care | Payer: BC Managed Care – PPO | Source: Ambulatory Visit | Attending: General Surgery | Admitting: General Surgery

## 2022-10-18 ENCOUNTER — Other Ambulatory Visit: Payer: Self-pay

## 2022-10-18 NOTE — Patient Instructions (Addendum)
Your procedure is scheduled on: Wednesday 10/23/22 To find out your arrival time, please call [phone] between 1PM - 3PM on:   Tuesday 10/22/22 Report to the Registration Desk on the 1st floor of the [facility]. Free Valet parking is available.  If your arrival time is 6:00 am, do not arrive before that time as the [facility] entrance doors do not open until 6:00 am.  REMEMBER: Instructions that are not followed completely may result in serious medical risk, up to and including death; or upon the discretion of your surgeon and anesthesiologist your surgery may need to be rescheduled.  One week prior to surgery: Stop Anti-inflammatories (NSAIDS) such as Advil, Aleve, Ibuprofen, Motrin, Naproxen, Naprosyn and Aspirin based products such as Excedrin, Goody's Powder, BC Powder. You may however, continue to take Tylenol if needed for pain up until the day of surgery.  Stop ANY OVER THE COUNTER supplements until after surgery.  Continue taking all prescribed medications.  TAKE ONLY THESE MEDICATIONS THE MORNING OF SURGERY WITH A SIP OF WATER:  none  No Alcohol for 24 hours before or after surgery.  No Smoking including e-cigarettes for 24 hours before surgery.  No chewable tobacco products for at least 6 hours before surgery.  No nicotine patches on the day of surgery.  Do not use any "recreational" drugs for at least a week (preferably 2 weeks) before your surgery.  Please be advised that the combination of cocaine and anesthesia may have negative outcomes, up to and including death. If you test positive for cocaine, your surgery will be cancelled.  On the morning of surgery brush your teeth with toothpaste and water, you may rinse your mouth with mouthwash if you wish. Do not swallow any toothpaste or mouthwash.  Use CHG Soap or wipes as directed on instruction sheet. Shower with your liquids Dial if available or your regular soap.  Do not wear lotions, powders, or perfumes.    Do not shave body hair from the neck down 48 hours before surgery.  Wear comfortable clothing (specific to your surgery type) to the hospital.  Do not wear jewelry, make-up, hairpins, clips or nail polish.  Contact lenses, hearing aids and dentures may not be worn into surgery.  Do not bring valuables to the hospital. [facility] is not responsible for any missing/lost belongings or valuables.   Notify your doctor if there is any change in your medical condition (cold, fever, infection).  If you are being discharged the day of surgery, you will not be allowed to drive home. You will need a responsible individual to drive you home and stay with you for 24 hours after surgery.   If you are taking public transportation, you will need to have a responsible individual with you.  If you are being admitted to the hospital overnight, leave your suitcase in the car. After surgery it may be brought to your room.  In case of increased patient census, it may be necessary for you, the patient, to continue your postoperative care in the Same Day Surgery department.  After surgery, you can help prevent lung complications by doing breathing exercises.  Take deep breaths and cough every 1-2 hours. Your doctor may order a device called an Incentive Spirometer to help you take deep breaths. When coughing or sneezing, hold a pillow firmly against your incision with both hands. This is called "splinting." Doing this helps protect your incision. It also decreases belly discomfort.  Surgery Visitation Policy:  Patients undergoing  a surgery or procedure may have two family members or support persons with them as long as the person is not COVID-19 positive or experiencing its symptoms.   Inpatient Visitation:    Visiting hours are 7 a.m. to 8 p.m. Up to four visitors are allowed at one time in a patient room. The visitors may rotate out with other people during the day. One designated support person  (adult) may remain overnight.  .  Su procedimiento est programado para el: mircoles 19/06/24 Para saber su hora de llegada, llame al [phone] entre la 1:00 p. m. y las 3:00 p. m. Jon Decker 10/22/22. Presntese en el mostrador de Tax adviser del [employer] Hay servicio de aparcacoches gratuito disponible.  Si su hora de llegada es a las 6:00 am, no llegue antes de esa hora ya que las puertas de [place] del [facility] no se abren Teacher, adult education las 6:00 am.  RECORDAR: Las instrucciones que no se siguen completamente pueden provocar riesgos mdicos graves, que pueden llegar hasta la [place]; o, segn el criterio de su cirujano y Scientific laboratory technician, es posible que sea Aeronautical engineer su Leisure centre manager.  Una semana antes de la ciruga: Detenga los antiinflamatorios (AINE) como Advil, Aleve, Ibuprofeno, Motrin, Naproxeno, Naprosyn y productos a base de aspirina como Excedrin, Goody's Powder, BC Powder. Sin embargo, puede Educational psychologist tomando Tylenol si es necesario para Marketing executive de la [place].  Deje de [employer] suplemento SIN RECETA hasta despus de la [place].  Contine tomando todos los medicamentos recetados.  TOME SLO ESTOS MEDICAMENTOS LA MAANA DE LA CIRUGA CON UN SORBO DE AGUA:  1. ninguno  No consumir alcohol durante 24 horas antes o despus de la [place].  No fumar, incluidos los cigarrillos electrnicos, durante las 24 horas previas a la [place].  No consumir productos de tabaco masticables durante al menos 6 horas antes de la [place].  Sin parches de Optometrist de la [place].  No use ningn medicamento "recreativo" durante al menos una semana (preferiblemente 2 semanas) antes de la ciruga.  Tenga en cuenta que la combinacin de cocana y anestesia puede [employer], que pueden llegar hasta la [place]. Si su prueba de cocana da positivo, su ciruga ser cancelada.  La maana de la ciruga cepille sus dientes con pasta dental y agua,  puede enjuagarse la boca con enjuague bucal si lo desea. No ingiera pasta de dientes ni enjuague bucal.  Utilice jabn o toallitas CHG como se indica en la hoja de instrucciones. Dchate con tus lquidos [place] si los tienes o con tu jabn habitual.  No use lociones, polvos ni perfumes.   No se afeite el vello corporal desde el cuello hacia abajo 48 horas antes de la [place].  Lleve ropa cmoda (especfica para su tipo de [place]) al hospital.  No use joyas, maquillaje, horquillas, clips ni esmalte de uas.  No se pueden usar lentes de contacto, audfonos ni dentaduras postizas durante la [place].  No lleve objetos de valor al hospital. [facility] no es responsable de ninguna pertenencia u objeto de valor perdido o perdido.   Notifique a su mdico si hay algn cambio en su condicin mdica (resfriado, fiebre, infeccin).  Si le dan el alta el da de la [place], no se le permitir conducir a casa. Necesitar que una persona responsable lo lleve a su casa y se quede con usted durante las 24 horas posteriores a la [place].   Si viaja en transporte pblico, deber  ir acompaado de una persona responsable.  Si va a pasar la noche en el hospital, deje su maleta en el coche. Despus de la [place], es posible que lo lleven a su habitacin.  En caso de un mayor censo de [place], puede ser necesario que usted, el [place], contine con su atencin posoperatoria en el departamento de [place] el [place].  Despus de la [place], usted puede ayudar a prevenir complicaciones pulmonares haciendo ejercicios de respiracin.  Respire profundamente y tosa cada 1 o 2 horas. Su mdico puede indicarle un dispositivo llamado espirmetro incentivador para ayudarle a respirar profundamente. Al toser o Engineering geologist, sostenga firmemente una almohada contra la incisin con ambas manos. Esto se llama "ferulizacin". Hacer esto ayuda a proteger su incisin. Tambin disminuye las molestias abdominales.  Poltica  de visitas a ciruga:  [employer] se someten a [place] o procedimiento pueden Del[employer] familiares o personas de apoyo con ellos, siempre y cuando la persona no sea positiva para COVID-19 ni experimente sus sntomas.   Visitas para pacientes hospitalizados:    El horario de visita es de 7 a 20 horas. Se permiten hasta cuatro visitantes a la vez en la habitacin de un paciente. Los visitantes podrn rotar con Garment/textile technologist. Neomia Dear persona de apoyo designada (adulto) podr pasar la noche.  Llame al Departamento de pruebas previas a la admisin al [phone] si tiene alguna pregunta sobre estas instrucciones.

## 2022-10-22 MED ORDER — LACTATED RINGERS IV SOLN: INTRAVENOUS | Status: DC

## 2022-10-22 MED ORDER — CEFAZOLIN SODIUM-DEXTROSE 2-4 GM/100ML-% IV SOLN
2.0000 g | INTRAVENOUS | Status: AC
  Administered 2022-10-23: 2 g via INTRAVENOUS

## 2022-10-22 MED ORDER — CHLORHEXIDINE GLUCONATE 0.12 % MT SOLN
15.0000 mL | Freq: Once | OROMUCOSAL | Status: AC
  Administered 2022-10-23: 15 mL via OROMUCOSAL

## 2022-10-22 MED ORDER — FAMOTIDINE 20 MG PO TABS
20.0000 mg | ORAL_TABLET | Freq: Once | ORAL | Status: AC
  Administered 2022-10-23: 20 mg via ORAL

## 2022-10-22 MED ORDER — ORAL CARE MOUTH RINSE: 15.0000 mL | Freq: Once | OROMUCOSAL | Status: AC

## 2022-10-23 ENCOUNTER — Ambulatory Visit: Payer: BC Managed Care – PPO | Admitting: Certified Registered Nurse Anesthetist

## 2022-10-23 ENCOUNTER — Ambulatory Visit
Admission: RE | Admit: 2022-10-23 | Discharge: 2022-10-23 | Disposition: A | Discharge status: Home / Self Care | Payer: BC Managed Care – PPO | Attending: General Surgery | Admitting: General Surgery

## 2022-10-23 ENCOUNTER — Other Ambulatory Visit: Payer: Self-pay

## 2022-10-23 ENCOUNTER — Encounter: Payer: Self-pay | Admitting: General Surgery

## 2022-10-23 ENCOUNTER — Encounter
Admission: RE | Disposition: A | Discharge status: Home / Self Care | Payer: Self-pay | Source: Home / Self Care | Attending: General Surgery

## 2022-10-23 DIAGNOSIS — K4090 Unilateral inguinal hernia, without obstruction or gangrene, not specified as recurrent: Secondary | ICD-10-CM | POA: Insufficient documentation

## 2022-10-23 HISTORY — PX: INSERTION OF MESH: SHX5868

## 2022-10-23 SURGERY — REPAIR, HERNIA, INGUINAL, BILATERAL, ROBOT-ASSISTED
Anesthesia: General | Site: Groin | Laterality: Right

## 2022-10-23 MED ORDER — FENTANYL CITRATE (PF) 100 MCG/2ML IJ SOLN
INTRAMUSCULAR | Status: AC
  Filled 2022-10-23: qty 2

## 2022-10-23 MED ORDER — BUPIVACAINE HCL (PF) 0.25 % IJ SOLN
INTRAMUSCULAR | Status: AC
  Filled 2022-10-23: qty 30

## 2022-10-23 MED ORDER — PROPOFOL 10 MG/ML IV BOLUS
INTRAVENOUS | Status: DC | PRN
  Administered 2022-10-23: 180 mg via INTRAVENOUS

## 2022-10-23 MED ORDER — HYDROCODONE-ACETAMINOPHEN 5-325 MG PO TABS: 1.0000 | ORAL_TABLET | ORAL | 0 refills | Status: AC | PRN

## 2022-10-23 MED ORDER — DEXAMETHASONE SODIUM PHOSPHATE 10 MG/ML IJ SOLN
INTRAMUSCULAR | Status: AC
  Filled 2022-10-23: qty 1

## 2022-10-23 MED ORDER — ONDANSETRON HCL 4 MG/2ML IJ SOLN
INTRAMUSCULAR | Status: AC
  Filled 2022-10-23: qty 2

## 2022-10-23 MED ORDER — LIDOCAINE HCL (CARDIAC) PF 100 MG/5ML IV SOSY
PREFILLED_SYRINGE | INTRAVENOUS | Status: DC | PRN
  Administered 2022-10-23: 100 mg via INTRAVENOUS

## 2022-10-23 MED ORDER — CEFAZOLIN SODIUM-DEXTROSE 2-4 GM/100ML-% IV SOLN
INTRAVENOUS | Status: AC
  Filled 2022-10-23: qty 100

## 2022-10-23 MED ORDER — SUGAMMADEX SODIUM 200 MG/2ML IV SOLN
INTRAVENOUS | Status: DC | PRN
  Administered 2022-10-23: 200 mg via INTRAVENOUS

## 2022-10-23 MED ORDER — ROCURONIUM BROMIDE 100 MG/10ML IV SOLN
INTRAVENOUS | Status: DC | PRN
  Administered 2022-10-23: 50 mg via INTRAVENOUS
  Administered 2022-10-23: 10 mg via INTRAVENOUS

## 2022-10-23 MED ORDER — DROPERIDOL 2.5 MG/ML IJ SOLN: 0.6250 mg | Freq: Once | INTRAMUSCULAR | Status: DC | PRN

## 2022-10-23 MED ORDER — ACETAMINOPHEN 10 MG/ML IV SOLN
INTRAVENOUS | Status: AC
  Filled 2022-10-23: qty 100

## 2022-10-23 MED ORDER — BUPIVACAINE-EPINEPHRINE 0.25% -1:200000 IJ SOLN
INTRAMUSCULAR | Status: DC | PRN
  Administered 2022-10-23: 30 mL

## 2022-10-23 MED ORDER — OXYCODONE HCL 5 MG PO TABS
ORAL_TABLET | ORAL | Status: AC
  Filled 2022-10-23: qty 1

## 2022-10-23 MED ORDER — CHLORHEXIDINE GLUCONATE 0.12 % MT SOLN
OROMUCOSAL | Status: AC
  Filled 2022-10-23: qty 15

## 2022-10-23 MED ORDER — FENTANYL CITRATE (PF) 100 MCG/2ML IJ SOLN
INTRAMUSCULAR | Status: DC | PRN
  Administered 2022-10-23: 100 ug via INTRAVENOUS

## 2022-10-23 MED ORDER — MIDAZOLAM HCL 2 MG/2ML IJ SOLN
INTRAMUSCULAR | Status: AC
  Filled 2022-10-23: qty 2

## 2022-10-23 MED ORDER — PROPOFOL 10 MG/ML IV BOLUS
INTRAVENOUS | Status: AC
  Filled 2022-10-23: qty 20

## 2022-10-23 MED ORDER — EPINEPHRINE PF 1 MG/ML IJ SOLN
INTRAMUSCULAR | Status: AC
  Filled 2022-10-23: qty 1

## 2022-10-23 MED ORDER — ROCURONIUM BROMIDE 10 MG/ML (PF) SYRINGE
PREFILLED_SYRINGE | INTRAVENOUS | Status: AC
  Filled 2022-10-23: qty 10

## 2022-10-23 MED ORDER — LIDOCAINE HCL (PF) 2 % IJ SOLN
INTRAMUSCULAR | Status: AC
  Filled 2022-10-23: qty 5

## 2022-10-23 MED ORDER — MIDAZOLAM HCL 2 MG/2ML IJ SOLN
INTRAMUSCULAR | Status: DC | PRN
  Administered 2022-10-23: 2 mg via INTRAVENOUS

## 2022-10-23 MED ORDER — OXYCODONE HCL 5 MG PO TABS
5.0000 mg | ORAL_TABLET | ORAL | Status: AC | PRN
  Administered 2022-10-23: 5 mg via ORAL

## 2022-10-23 MED ORDER — ONDANSETRON HCL 4 MG/2ML IJ SOLN
INTRAMUSCULAR | Status: DC | PRN
  Administered 2022-10-23: 4 mg via INTRAVENOUS

## 2022-10-23 MED ORDER — PHENYLEPHRINE HCL (PRESSORS) 10 MG/ML IV SOLN
INTRAVENOUS | Status: DC | PRN
  Administered 2022-10-23 (×3): 80 ug via INTRAVENOUS

## 2022-10-23 MED ORDER — FENTANYL CITRATE (PF) 100 MCG/2ML IJ SOLN
25.0000 ug | INTRAMUSCULAR | Status: DC | PRN
  Administered 2022-10-23: 50 ug via INTRAVENOUS
  Administered 2022-10-23 (×2): 25 ug via INTRAVENOUS

## 2022-10-23 MED ORDER — FAMOTIDINE 20 MG PO TABS
ORAL_TABLET | ORAL | Status: AC
  Filled 2022-10-23: qty 1

## 2022-10-23 MED ORDER — DEXAMETHASONE SODIUM PHOSPHATE 10 MG/ML IJ SOLN
INTRAMUSCULAR | Status: DC | PRN
  Administered 2022-10-23: 10 mg via INTRAVENOUS

## 2022-10-23 MED ORDER — ACETAMINOPHEN 10 MG/ML IV SOLN
INTRAVENOUS | Status: DC | PRN
  Administered 2022-10-23: 1000 mg via INTRAVENOUS

## 2022-10-23 SURGICAL SUPPLY — 53 items
ADH SKN CLS APL DERMABOND .7 (GAUZE/BANDAGES/DRESSINGS) ×2
BAG PRESSURE INF REUSE 1000 (BAG) IMPLANT
BLADE SURG SZ11 CARB STEEL (BLADE) ×2 IMPLANT
COVER TIP SHEARS 8 DVNC (MISCELLANEOUS) ×2 IMPLANT
COVER WAND RF STERILE (DRAPES) ×2 IMPLANT
DERMABOND ADVANCED .7 DNX12 (GAUZE/BANDAGES/DRESSINGS) ×2 IMPLANT
DRAPE ARM DVNC X/XI (DISPOSABLE) ×6 IMPLANT
DRAPE COLUMN DVNC XI (DISPOSABLE) ×2 IMPLANT
ELECT REM PT RETURN 9FT ADLT (ELECTROSURGICAL) ×2
ELECTRODE REM PT RTRN 9FT ADLT (ELECTROSURGICAL) ×2 IMPLANT
FORCEPS BPLR R/ABLATION 8 DVNC (INSTRUMENTS) ×2 IMPLANT
GLOVE BIO SURGEON STRL SZ 6.5 (GLOVE) ×4 IMPLANT
GLOVE BIOGEL PI IND STRL 6.5 (GLOVE) ×4 IMPLANT
GOWN STRL REUS W/ TWL LRG LVL3 (GOWN DISPOSABLE) ×6 IMPLANT
GOWN STRL REUS W/TWL LRG LVL3 (GOWN DISPOSABLE) ×6
IRRIGATOR SUCT 8 DISP DVNC XI (IRRIGATION / IRRIGATOR) IMPLANT
IV CATH ANGIO 12GX3 LT BLUE (NEEDLE) IMPLANT
IV NS 1000ML (IV SOLUTION)
IV NS 1000ML BAXH (IV SOLUTION) IMPLANT
KIT PINK PAD W/HEAD ARE REST (MISCELLANEOUS) ×2
KIT PINK PAD W/HEAD ARM REST (MISCELLANEOUS) ×2 IMPLANT
LABEL OR SOLS (LABEL) IMPLANT
MANIFOLD NEPTUNE II (INSTRUMENTS) ×2 IMPLANT
MESH 3DMAX MID 4X6 LT LRG (Mesh General) IMPLANT
MESH 3DMAX MID 4X6 RT LRG (Mesh General) IMPLANT
MESH 3DMAX MID 5X7 LT XLRG (Mesh General) IMPLANT
MESH 3DMAX MID 5X7 RT XLRG (Mesh General) IMPLANT
NDL DRIVE SUT CUT DVNC (INSTRUMENTS) ×2 IMPLANT
NDL HYPO 22X1.5 SAFETY MO (MISCELLANEOUS) ×2 IMPLANT
NDL INSUFFLATION 14GA 120MM (NEEDLE) ×2 IMPLANT
NEEDLE DRIVE SUT CUT DVNC (INSTRUMENTS) ×2 IMPLANT
NEEDLE HYPO 22X1.5 SAFETY MO (MISCELLANEOUS) ×2 IMPLANT
NEEDLE INSUFFLATION 14GA 120MM (NEEDLE) ×2 IMPLANT
OBTURATOR OPTICAL STND 8 DVNC (TROCAR) ×2
OBTURATOR OPTICALSTD 8 DVNC (TROCAR) ×2 IMPLANT
PACK LAP CHOLECYSTECTOMY (MISCELLANEOUS) ×2 IMPLANT
SCISSORS MNPLR CVD DVNC XI (INSTRUMENTS) ×2 IMPLANT
SEAL UNIV 5-12 XI (MISCELLANEOUS) ×6 IMPLANT
SET TUBE SMOKE EVAC HIGH FLOW (TUBING) ×2 IMPLANT
SOL ELECTROSURG ANTI STICK (MISCELLANEOUS) ×2
SOLUTION ELECTROSURG ANTI STCK (MISCELLANEOUS) ×2 IMPLANT
SUT MNCRL 4-0 (SUTURE) ×2
SUT MNCRL 4-0 27XMFL (SUTURE) ×2
SUT VIC AB 2-0 SH 27 (SUTURE) ×2
SUT VIC AB 2-0 SH 27XBRD (SUTURE) ×2 IMPLANT
SUT VIC AB 3-0 SH 27 (SUTURE) ×2
SUT VIC AB 3-0 SH 27X BRD (SUTURE) IMPLANT
SUT VLOC 90 S/L VL9 GS22 (SUTURE) ×2 IMPLANT
SUTURE MNCRL 4-0 27XMF (SUTURE) ×2 IMPLANT
TAPE TRANSPORE STRL 2 31045 (GAUZE/BANDAGES/DRESSINGS) IMPLANT
TRAP FLUID SMOKE EVACUATOR (MISCELLANEOUS) ×2 IMPLANT
TRAY FOLEY MTR SLVR 16FR STAT (SET/KITS/TRAYS/PACK) ×2 IMPLANT
WATER STERILE IRR 500ML POUR (IV SOLUTION) ×2 IMPLANT

## 2022-10-23 NOTE — Transfer of Care (Signed)
Immediate Anesthesia Transfer of Care Note  Patient: Kailum Ouimette  Procedure(s) Performed: XI ROBOTIC ASSISTED BILATERAL INGUINAL HERNIA (Bilateral: Groin) INSERTION OF MESH  Patient Location: PACU  Anesthesia Type:General  Level of Consciousness: awake and drowsy  Airway & Oxygen Therapy: Patient Spontanous Breathing and Patient connected to nasal cannula oxygen  Post-op Assessment: Report given to RN and Post -op Vital signs reviewed and stable  Post vital signs: Reviewed and stable  Last Vitals:  Vitals Value Taken Time  BP 110/80 10/23/22 0906  Temp 36.2 C 10/23/22 0905  Pulse 83 10/23/22 0913  Resp 18 10/23/22 0913  SpO2 100 % 10/23/22 0913  Vitals shown include unvalidated device data.  Last Pain:  Vitals:   10/23/22 0905  TempSrc:   PainSc: Asleep         Complications: No notable events documented.

## 2022-10-23 NOTE — Discharge Instructions (Addendum)
CIRUGIA AMBULATORIA       Instruccionnes de alta    Date Franco Nones) 10/23/22   1.  Las drogas que se Dispensing optician en su cuerpo [employer], asi            que por las proximas 24 horas usted no debe:   Conducir Field seismologist) un automovil   Hacer ninguna decision legal   Tomar ninguna bebida alcoholica  2.  A) Manana puede comenzar una dieta regular.  Es mejor que hoy empiece con           liquidos y gradualmente anada [address]       B) Puede comer cualquier comida que desee pero es mejor empezar con liquidos,                      luego sopitas con galletas saladas y gradualmente llegar a las comidas solidas.  3.  Por favor avise a su medico inmediatamente si usted tiene algun sangrado anormal,       tiene dificultad con la respiracion, enrojecimiento y Engineer, mining en el sitio de la cirugia, [place],       fiebro o dolor que se alivia con [place].  4.  A) Su visita posoperatoria (despues de su operacion) es con el  Dr. Maia Plan  Date    11/05/22               Time 845am        B)  Por favor llame para hacer la cita posoperatoria.  5.  Istrucciones especificas :

## 2022-10-23 NOTE — Progress Notes (Signed)
removed

## 2022-10-23 NOTE — Anesthesia Procedure Notes (Signed)
Procedure Name: Intubation Date/Time: 10/23/2022 7:40 AM  Performed by: Ginger Carne, CRNAPre-anesthesia Checklist: Emergency Drugs available, Patient identified, Suction available, Patient being monitored and Timeout performed Patient Re-evaluated:Patient Re-evaluated prior to induction Oxygen Delivery Method: Circle system utilized Preoxygenation: Pre-oxygenation with 100% oxygen Induction Type: IV induction Ventilation: Mask ventilation without difficulty and Oral airway inserted - appropriate to patient size Laryngoscope Size: McGraph and 3 Grade View: Grade I Tube type: Oral Tube size: 6.5 mm Number of attempts: 1 Airway Equipment and Method: Stylet and Video-laryngoscopy Placement Confirmation: ETT inserted through vocal cords under direct vision and positive ETCO2 Secured at: 21 cm Tube secured with: Tape Dental Injury: Teeth and Oropharynx as per pre-operative assessment

## 2022-10-23 NOTE — Anesthesia Preprocedure Evaluation (Signed)
Anesthesia Evaluation  Patient identified by MRN, date of birth, ID band Patient awake    Reviewed: Allergy & Precautions, NPO status , Patient's Chart, lab work & pertinent test results  History of Anesthesia Complications Negative for: history of anesthetic complications  Airway Mallampati: II  TM Distance: >3 FB     Dental  (+) Dental Advidsory Given, Chipped   Pulmonary neg pulmonary ROS   Pulmonary exam normal        Cardiovascular negative cardio ROS      Neuro/Psych negative neurological ROS  negative psych ROS   GI/Hepatic negative GI ROS, Neg liver ROS,,,  Endo/Other  negative endocrine ROS    Renal/GU negative Renal ROS  negative genitourinary   Musculoskeletal negative musculoskeletal ROS (+)    Abdominal   Peds negative pediatric ROS (+)  Hematology negative hematology ROS (+)   Anesthesia Other Findings Past Medical History: No date: Medical history non-contributory  Obesity  Reproductive/Obstetrics                             Anesthesia Physical Anesthesia Plan  ASA: 2  Anesthesia Plan: General   Post-op Pain Management:    Induction: Intravenous  PONV Risk Score and Plan: Ondansetron, Dexamethasone, Midazolam and Treatment may vary due to age or medical condition  Airway Management Planned: Oral ETT  Additional Equipment:   Intra-op Plan:   Post-operative Plan: Extubation in OR  Informed Consent: I have reviewed the patients History and Physical, chart, labs and discussed the procedure including the risks, benefits and alternatives for the proposed anesthesia with the patient or authorized representative who has indicated his/her understanding and acceptance.     Dental advisory given  Plan Discussed with: CRNA and Surgeon  Anesthesia Plan Comments:         Anesthesia Quick Evaluation

## 2022-10-23 NOTE — Interval H&P Note (Signed)
History and Physical Interval Note:  10/23/2022 6:47 AM  Jon Decker  has presented today for surgery, with the diagnosis of K40.90 non recurrent unilateral inguinal hernia w/o obstruction or gangrene.  The various methods of treatment have been discussed with the patient and family. After consideration of risks, benefits and other options for treatment, the patient has consented to  Procedure(s): XI ROBOTIC ASSISTED BILATERAL INGUINAL HERNIA (Bilateral) as a surgical intervention.  The patient's history has been reviewed, patient examined, no change in status, stable for surgery.  I have reviewed the patient's chart and labs.  Questions were answered to the patient's satisfaction.     Carolan Shiver

## 2022-10-23 NOTE — Op Note (Signed)
Preoperative diagnosis: Right inguinal hernia.   Postoperative diagnosis: Right inguinal hernia.  Procedure: Robotic assisted Laparoscopic Transabdominal preperitoneal laparoscopic (TAPP) repair of right inguinal hernia.  Anesthesia: GETA  Surgeon: Dr. Hazle Quant  Wound Classification: Clean  Indications:  Patient is a 47 y.o. male developed a symptomatic right inguinal hernia. Repair was indicated.  Findings: 1. Right indirect Inguinal hernia identified 2. Vas deferens and cord structures identified and preserved 3. Bard Extra Large 3D Max MID Anatomical mesh used for repair 4. Adequate hemostasis.         Description of procedure:  The patient was taken to the operating room and the correct side of surgery was verified. The patient was placed supine with right arm tucked at the side. After obtaining adequate anesthesia, the patient's abdomen was prepped and draped in standard sterile fashion. A time-out was completed verifying correct patient, procedure, site, positioning, and implant(s) and/or special equipment prior to beginning this procedure.  An incision was made in a natural skin line above the umbilicus. The fascia was elevated and the Veress needle inserted. Proper position was confirmed by aspiration and saline meniscus test.  The abdomen was insufflated with carbon dioxide to a pressure of 15 mmHg. The patient tolerated insufflation well.  Abdominal cavity was entered using Optiview technique with a millimeter trocar.  No injury was identified.  Another 2 mm trocars were placed lateral to each rectus muscle.  Scissors and bipolar forceps were inserted under direct visualization. At the robotic console: Transverse peritoneal incision is made about 8 cm superior to the inguinal defect. Medial to the epigastric vessels, the parietal compartment is dissected to visualize the rectus muscle. This is carried down to the symphysis pubis and the retropubic space is dissected to  expose at least 2 cm contralateral to the midline. Cooper's ligament is exposed and cleared at least 2 cm below the ligament to ensure adequate space for the inferior border of the mesh. Hesselbach's triangle is cleared assessing for a direct hernia.  Lateral to the epigastric vessels, the dissection is carried out in visceral compartment continuing in the true preperitoneal plane. Indirect hernia sac, was carefully reduced and separated from the cord structures with medial retraction and a combination of blunt/sharp dissection and focused cautery. This dissection was continued until the cord structures are "parietalized" completely, allowing for visualization of the reflected peritoneum that is continuous with the line originating 2 cm below Coopers medially and across the psoas muscle in the lateral compartment.  The internal ring was interrogated for a cord lipoma. The cord lipoma was reduced to the retroperitoneum and seated dorsal to the preperitoneal mesh. Having achieved a complete dissection with a critical view of the entire myopectineal orifice, an XL mesh was then positioned centered at the iliopubic tract with the medial side crossing the midline and the inferior edge positioned 2 cm below Coopers ligament. The lateral aspect of the mesh extended 3-5 cm beyond the lateral edge of the psoas. The mesh is fixated using an interrupted suture placed to the ipsilateral Coopers ligament. A second suture was done at the medial superior aspect of the mesh fixating this to the rectus complex.  The peritoneal flap is closed with running barbed suture. Additional holes in the peritoneum closed with suture. Preperitoneal space gas aspirated to visualize the peritoneum apposed directly against the mesh and ensure no folding, lifting, or buckling of the mesh. Skin is closed, sterile dressings are applied.  The patient tolerated the procedure well and was taken  to the postanesthesia care unit in stable  condition  Specimen: None  Complications: None  Estimated Blood Loss: 5 mL

## 2022-10-28 NOTE — Anesthesia Postprocedure Evaluation (Signed)
Anesthesia Post Note  Patient: Jon Decker  Procedure(s) Performed: XI ROBOTIC ASSISTED RIGHT INGUINAL HERNIA (Right: Groin) INSERTION OF MESH  Patient location during evaluation: PACU Anesthesia Type: General Level of consciousness: awake and alert Pain management: pain level controlled Vital Signs Assessment: post-procedure vital signs reviewed and stable Respiratory status: spontaneous breathing, nonlabored ventilation, respiratory function stable and patient connected to nasal cannula oxygen Cardiovascular status: blood pressure returned to baseline and stable Postop Assessment: no apparent nausea or vomiting Anesthetic complications: no   No notable events documented.   Last Vitals:  Vitals:   10/23/22 1000 10/23/22 1019  BP: 128/89 130/88  Pulse: 86 88  Resp: 16 16  Temp:  36.7 C  SpO2: 99% 99%    Last Pain:  Vitals:   10/23/22 1019  TempSrc: Temporal  PainSc:                  Lenard Simmer

## 2024-01-20 NOTE — H&P (View-Only) (Signed)
 History of Present Illness Jon Decker is a 48 year old male who presents for evaluation of an umbilical hernia.  He has been experiencing pain associated with the umbilical hernia, particularly when lifting his daughter. The pain has become more noticeable recently, although it was not present immediately after his previous surgery. The pain is exacerbated by pressure on the area.  The umbilical hernia became symptomatic approximately three months after a previous surgical procedure. He describes the pain as mild but noticeable during certain activities, such as lifting his child, who weighs about 30 pounds.  He has a history of a previous surgical procedure for an inguinal hernia, which he relates to the current umbilical hernia, although he acknowledges they are separate issues. He took two weeks off work following the previous surgery.  He has a young daughter, approximately one year and 47 old, whom he frequently lifts and carries.      PAST MEDICAL HISTORY:  Past medical history reviewed.  No pertinent past medical history     PAST SURGICAL HISTORY:   Past Surgical History:  Procedure Laterality Date  . COLONOSCOPY  08/28/2020   Tubular adenomas/Repeat 8yrs/CTL  . ROBOT ASSISTED LAPAROSCOPIC REPAIR INGUINAL HERNIA Right 10/23/2022   Dr  Lucas Catchings         MEDICATIONS:  Outpatient Encounter Medications as of 01/20/2024  Medication Sig Dispense Refill  . albuterol  90 mcg/actuation inhaler Inhale 2 inhalations into the lungs every 4 (four) hours as needed for Wheezing or Shortness of Breath 1 each 1  . ibuprofen  (MOTRIN ) 800 MG tablet Take 800 mg by mouth every 8 (eight) hours as needed for Pain     No facility-administered encounter medications on file as of 01/20/2024.     ALLERGIES:   Patient has no known allergies.   SOCIAL HISTORY:  Social History   Socioeconomic History  . Marital status: Married  Tobacco Use  . Smoking status: Never     Passive exposure: Never  . Smokeless tobacco: Never  Vaping Use  . Vaping status: Never Used  Substance and Sexual Activity  . Alcohol use: Yes    Comment: Occasional- beer  . Drug use: No  . Sexual activity: Defer   Social Drivers of Health   Financial Resource Strain: Low Risk  (03/11/2023)   Overall Financial Resource Strain (CARDIA)   . Difficulty of Paying Living Expenses: Not hard at all  Food Insecurity: No Food Insecurity (03/11/2023)   Hunger Vital Sign   . Worried About Programme researcher, broadcasting/film/video in the Last Year: Never true   . Ran Out of Food in the Last Year: Never true  Transportation Needs: No Transportation Needs (03/11/2023)   PRAPARE - Transportation   . Lack of Transportation (Medical): No   . Lack of Transportation (Non-Medical): No    FAMILY HISTORY:  Family History  Problem Relation Name Age of Onset  . Ulcers Brother       GENERAL REVIEW OF SYSTEMS:   General ROS: negative for - chills, fatigue, fever, weight gain or weight loss Allergy and Immunology ROS: negative for - hives  Hematological and Lymphatic ROS: negative for - bleeding problems or bruising, negative for palpable nodes Endocrine ROS: negative for - heat or cold intolerance, hair changes Respiratory ROS: negative for - cough, shortness of breath or wheezing Cardiovascular ROS: no chest pain or palpitations GI ROS: negative for nausea, vomiting, abdominal pain, diarrhea, constipation Musculoskeletal ROS: negative for - joint swelling or muscle  pain Neurological ROS: negative for - confusion, syncope Dermatological ROS: negative for pruritus and rash  PHYSICAL EXAM:  Vitals:   01/20/24 1539  BP: 135/87  Pulse: 82  .  Ht:160 cm (5' 3) Wt:95.3 kg (210 lb) ADJ:Anib surface area is 2.06 meters squared. Body mass index is 37.2 kg/m.SABRA   GENERAL: Alert, active, oriented x3  HEENT: Pupils equal reactive to light. Extraocular movements are intact. Sclera clear. Palpebral conjunctiva normal  red color.Pharynx clear.  NECK: Supple with no palpable mass and no adenopathy.  LUNGS: Sound clear with no rales rhonchi or wheezes.  HEART: Regular rhythm S1 and S2 without murmur.  ABDOMEN: Soft and depressible, nontender with no palpable mass, no hepatomegaly.  Umbilical hernia, no completely reduced due to pain.  EXTREMITIES: Well-developed well-nourished symmetrical with no dependent edema.  NEUROLOGICAL: Awake alert oriented, facial expression symmetrical, moving all extremities.    Assessment & Plan Umbilical hernia without obstruction or gangrene   An umbilical hernia was identified three months after inguinal hernia surgery. He experiences mild pain, particularly when lifting his child. The hernia is visible when standing, and the pain is unpredictable, with potential for worsening. The hernia will not resolve on its own and may enlarge or become more painful if left untreated. Schedule surgical repair to prevent further enlargement and increased pain.  Advise against heavy lifting or strenuous activity for two weeks post-surgery. Instruct to avoid constipation after surgery. Recovery time is approximately two weeks, with possible extension if complications occur. Provide documentation for employer regarding time off work.   Umbilical hernia without obstruction and without gangrene [K42.9]         Patient verbalized understanding, all questions were answered, and were agreeable with the plan outlined above.   I spent a total of 50 minutes in both face-to-face and non-face-to-face activities, excluding procedures performed, for this visit on the date of this encounter.   Lucas Sjogren, MD  Electronically signed by Lucas Sjogren, MD

## 2024-01-20 NOTE — Progress Notes (Addendum)
 History of Present Illness Jon Decker is a 48 year old male who presents for evaluation of an umbilical hernia.  He has been experiencing pain associated with the umbilical hernia, particularly when lifting his daughter. The pain has become more noticeable recently, although it was not present immediately after his previous surgery. The pain is exacerbated by pressure on the area.  The umbilical hernia became symptomatic approximately three months after a previous surgical procedure. He describes the pain as mild but noticeable during certain activities, such as lifting his child, who weighs about 30 pounds.  He has a history of a previous surgical procedure for an inguinal hernia, which he relates to the current umbilical hernia, although he acknowledges they are separate issues. He took two weeks off work following the previous surgery.  He has a young daughter, approximately one year and 47 old, whom he frequently lifts and carries.      PAST MEDICAL HISTORY:  Past medical history reviewed.  No pertinent past medical history     PAST SURGICAL HISTORY:   Past Surgical History:  Procedure Laterality Date  . COLONOSCOPY  08/28/2020   Tubular adenomas/Repeat 8yrs/CTL  . ROBOT ASSISTED LAPAROSCOPIC REPAIR INGUINAL HERNIA Right 10/23/2022   Dr  Lucas Catchings         MEDICATIONS:  Outpatient Encounter Medications as of 01/20/2024  Medication Sig Dispense Refill  . albuterol  90 mcg/actuation inhaler Inhale 2 inhalations into the lungs every 4 (four) hours as needed for Wheezing or Shortness of Breath 1 each 1  . ibuprofen  (MOTRIN ) 800 MG tablet Take 800 mg by mouth every 8 (eight) hours as needed for Pain     No facility-administered encounter medications on file as of 01/20/2024.     ALLERGIES:   Patient has no known allergies.   SOCIAL HISTORY:  Social History   Socioeconomic History  . Marital status: Married  Tobacco Use  . Smoking status: Never     Passive exposure: Never  . Smokeless tobacco: Never  Vaping Use  . Vaping status: Never Used  Substance and Sexual Activity  . Alcohol use: Yes    Comment: Occasional- beer  . Drug use: No  . Sexual activity: Defer   Social Drivers of Health   Financial Resource Strain: Low Risk  (03/11/2023)   Overall Financial Resource Strain (CARDIA)   . Difficulty of Paying Living Expenses: Not hard at all  Food Insecurity: No Food Insecurity (03/11/2023)   Hunger Vital Sign   . Worried About Programme researcher, broadcasting/film/video in the Last Year: Never true   . Ran Out of Food in the Last Year: Never true  Transportation Needs: No Transportation Needs (03/11/2023)   PRAPARE - Transportation   . Lack of Transportation (Medical): No   . Lack of Transportation (Non-Medical): No    FAMILY HISTORY:  Family History  Problem Relation Name Age of Onset  . Ulcers Brother       GENERAL REVIEW OF SYSTEMS:   General ROS: negative for - chills, fatigue, fever, weight gain or weight loss Allergy and Immunology ROS: negative for - hives  Hematological and Lymphatic ROS: negative for - bleeding problems or bruising, negative for palpable nodes Endocrine ROS: negative for - heat or cold intolerance, hair changes Respiratory ROS: negative for - cough, shortness of breath or wheezing Cardiovascular ROS: no chest pain or palpitations GI ROS: negative for nausea, vomiting, abdominal pain, diarrhea, constipation Musculoskeletal ROS: negative for - joint swelling or muscle  pain Neurological ROS: negative for - confusion, syncope Dermatological ROS: negative for pruritus and rash  PHYSICAL EXAM:  Vitals:   01/20/24 1539  BP: 135/87  Pulse: 82  .  Ht:160 cm (5' 3) Wt:95.3 kg (210 lb) ADJ:Anib surface area is 2.06 meters squared. Body mass index is 37.2 kg/m.SABRA   GENERAL: Alert, active, oriented x3  HEENT: Pupils equal reactive to light. Extraocular movements are intact. Sclera clear. Palpebral conjunctiva normal  red color.Pharynx clear.  NECK: Supple with no palpable mass and no adenopathy.  LUNGS: Sound clear with no rales rhonchi or wheezes.  HEART: Regular rhythm S1 and S2 without murmur.  ABDOMEN: Soft and depressible, nontender with no palpable mass, no hepatomegaly.  Umbilical hernia, no completely reduced due to pain.  EXTREMITIES: Well-developed well-nourished symmetrical with no dependent edema.  NEUROLOGICAL: Awake alert oriented, facial expression symmetrical, moving all extremities.    Assessment & Plan Umbilical hernia without obstruction or gangrene   An umbilical hernia was identified three months after inguinal hernia surgery. He experiences mild pain, particularly when lifting his child. The hernia is visible when standing, and the pain is unpredictable, with potential for worsening. The hernia will not resolve on its own and may enlarge or become more painful if left untreated. Schedule surgical repair to prevent further enlargement and increased pain.  Advise against heavy lifting or strenuous activity for two weeks post-surgery. Instruct to avoid constipation after surgery. Recovery time is approximately two weeks, with possible extension if complications occur. Provide documentation for employer regarding time off work.   Umbilical hernia without obstruction and without gangrene [K42.9]         Patient verbalized understanding, all questions were answered, and were agreeable with the plan outlined above.   I spent a total of 50 minutes in both face-to-face and non-face-to-face activities, excluding procedures performed, for this visit on the date of this encounter.   Lucas Sjogren, MD  Electronically signed by Lucas Sjogren, MD

## 2024-01-22 ENCOUNTER — Ambulatory Visit: Payer: Self-pay | Admitting: General Surgery

## 2024-01-27 ENCOUNTER — Encounter
Admission: RE | Admit: 2024-01-27 | Discharge: 2024-01-27 | Disposition: A | Discharge status: Home / Self Care | Source: Ambulatory Visit | Attending: General Surgery | Admitting: General Surgery

## 2024-01-27 ENCOUNTER — Other Ambulatory Visit: Payer: Self-pay

## 2024-01-27 HISTORY — DX: Prediabetes: R73.03

## 2024-01-27 NOTE — Patient Instructions (Addendum)
 Su procedimiento est programado para: Lunes 02/02/24 Presntese en el mostrador de Tax adviser del [employer] Para saber su hora de llegada, llame al [phone] entre la 1:00 p. m. y las 3:00 p. m. en: Viernes 01/30/24 Si su hora de llegada es a las 6:00 am, no llegue antes de esa hora ya que las puertas de [place] del [facility] no se abren Teacher, adult education las 6:00 am.  RECORDAR: Las instrucciones que no se siguen completamente pueden provocar riesgos mdicos graves, que pueden llegar hasta la muerte; o, segn el criterio de su cirujano y Scientific laboratory technician, es posible que sea Aeronautical engineer su Leisure centre manager.  No ingiera alimentos ni bebidas despus de la medianoche del da anterior a la ciruga. No mascar chicle ni caramelos duros.   Una semana antes de la ciruga: Detenga los antiinflamatorios (AINE) como Advil , Aleve, Ibuprofeno, Motrin , Naproxen, Naprosyn y productos a base de aspirina como Excedrin, Goody's Powder, BC Powder. Suspenda CUALQUIER suplemento de venta libre hasta despus de la ciruga.  Multiple Vitamin (MULTIVITAMIN WITH MINERALS)   Sin embargo, puede continuar tomando Tylenol  si es necesario para Press photographer da de la [place].  Contine tomando todos los medicamentos recetados, excepto los siguientes:  Siga las recomendaciones del cardilogo o PCP con respecto a suspender los anticoagulantes.  TOME SLO ESTOS MEDICAMENTOS LA MAANA DE LA CIRUGA CON UN SORBO DE AGUA:  Ninguna   No consumir alcohol durante 24 horas antes o despus de la [place].  No fumar, incluidos los cigarrillos electrnicos, durante las 24 horas previas a la [place]. No consumir productos de tabaco masticables durante al menos 6 horas antes de la [place]. Sin parches de Optometrist de la [place].  No use ningn medicamento recreativo durante al menos una semana (preferiblemente 2 semanas) antes de la ciruga. Tenga en cuenta que la combinacin de cocana y anestesia puede  tener resultados negativos, que pueden llegar hasta la [place]. Si su prueba de cocana da positivo, su ciruga ser cancelada.  La maana de la ciruga cepille sus dientes con pasta dental y agua, puede enjuagarse la boca con enjuague bucal si lo desea. No ingiera pasta de dientes ni enjuague bucal.  Utilice jabn o toallitas CHG como se indica en la hoja de instrucciones.  No use joyas, maquillaje, horquillas, clips ni esmalte de uas.  No use lociones, polvos ni perfumes.  No se afeite el vello corporal desde el cuello hacia abajo 48 horas antes de la [place].  No se pueden usar lentes de contacto, audfonos ni dentaduras postizas durante la [place].  No lleve objetos de valor al hospital. [facility] no es responsable de ninguna pertenencia u objeto de valor perdido o perdido.  Notifique a su mdico si hay algn cambio en su condicin mdica (resfriado, fiebre, infeccin).  Lleve ropa cmoda (especfica para su tipo de [place]) al hospital.  Despus de la ciruga, usted puede ayudar a prevenir complicaciones pulmonares haciendo ejercicios de respiracin. Respire profundamente y tosa cada 1 o 2 horas. Su mdico puede indicarle un dispositivo llamado espirmetro incentivador para ayudarle a respirar profundamente. Al toser o Engineering geologist, sostenga firmemente una almohada contra la incisin con ambas manos. Esto se llama ferulizacin. Hacer esto ayuda a proteger su incisin. Tambin disminuye las molestias abdominales.  Si vas a pasar la noche en el hospital, deja tu maleta en el coche. Despus de la [place], es posible que lo lleven a su habitacin.  En caso de un mayor censo de [place],  puede ser necesario que usted, el [place], contine con su atencin posoperatoria en el departamento de Ciruga el Mismo Da.  Si le dan el alta el da de la [place], no se le permitir conducir a casa. Necesitar que una persona responsable lo lleve a su casa y se quede con usted durante las 24  horas posteriores a la [place].  Si viaja en transporte pblico, deber ir acompaado de una persona responsable.  Llame al Departamento de pruebas previas a la admisin al [phone] si tiene alguna pregunta sobre estas instrucciones.  Poltica de visitas a ciruga:  [employer] se someten a bos[place] o procedimiento pueden [employer] familiares o personas de apoyo con ellos, siempre y cuando la persona no sea positiva para COVID-19 ni experimente sus sntomas.  Visitas para pacientes hospitalizados:  El horario de visita es de 7 a 20 horas. Se permiten hasta cuatro visitantes a la vez en la habitacin de un paciente. Los visitantes podrn rotar con Garment/textile technologist. Ignacia persona de apoyo designada (adulto) podr pasar la noche.  Debido a un aumento en las tasas de VSR e influenza y las hospitalizaciones asociadas, los nios menores de 12 aos no podrn visitar a los pacientes en los hospitales de [employer]. Se siguen recomendando encarecidamente las mascarillas.  Preparacin para la ciruga con jabn de GLUCONATO DE CLORHEXIDINA (CHG)  Jabn de gluconato de clorhexidina (CHG)  o Un limpiador antisptico que [employer] grmenes y se adhiere a la piel para seguir [employer] grmenes incluso despus del lavado.  o Se utiliza para ducharse la noche anterior a la [place] y la maana de la [place].  Antes de la [place], usted puede desempear un papel importante al reducir la cantidad de grmenes en su piel. El jabn CHG (gluconato de clorhexidina) es un limpiador antisptico que mata los grmenes y se adhiere a la piel para continuar matndolos incluso despus del lavado.  No lo utilice si es alrgico al CHG o a los jabones antibacterianos. Si su piel se enrojece o irrita, deje de usar CHG.  1. Ducharse la NOCHE ANTES DE LA CIRUGA y la [place] DE LA CIRUGA con jabn CHG.  2. Si eliges lavarte el cabello, lvalo primero como de costumbre con tu champ  habitual.  3. Despus del champ, enjuague bien el cabello y el cuerpo para eliminar el champ.  4. Utilice CHG como lo hara con cualquier otro jabn lquido. Puede aplicar CHG directamente sobre la piel y lavar suavemente con un pauelo o una toallita limpia.  5. Aplique el jabn CHG en su cuerpo nicamente desde el cuello hacia abajo. No utilizar en heridas abiertas o llagas abiertas. Evite el contacto con los ojos, odos, boca y genitales (partes privadas). Lvese la cara y los genitales (partes privadas) con su jabn habitual.  6. Lvese bien, prestando especial atencin al rea donde se realizar su ciruga.  7. Enjuague bien su cuerpo con agua tibia.  8. No se duche ni se lave con su jabn normal despus de usar y enjuagar el jabn CHG.  9. Squese dando palmaditas con una toalla limpia.  10. Use pijamas limpios para dormir la noche anterior a la ciruga.  12. Coloque sbanas limpias en su cama la noche de su primera ducha y no duerma con mascotas.  13. Ducharse nuevamente con el jabn CHG el da de la ciruga antes de llegar al hospital.  14. No aplique desodorantes, lociones o polvos.  15. Por favor  use ropa limpia al hospital.

## 2024-02-01 MED ORDER — CHLORHEXIDINE GLUCONATE 0.12 % MT SOLN
15.0000 mL | Freq: Once | OROMUCOSAL | Status: AC
  Administered 2024-02-02: 15 mL via OROMUCOSAL

## 2024-02-01 MED ORDER — LACTATED RINGERS IV SOLN: INTRAVENOUS | Status: DC

## 2024-02-01 MED ORDER — ORAL CARE MOUTH RINSE
15.0000 mL | Freq: Once | OROMUCOSAL | Status: AC
Start: 2024-02-01 — End: 2024-02-02

## 2024-02-01 MED ORDER — CEFAZOLIN SODIUM-DEXTROSE 2-4 GM/100ML-% IV SOLN
2.0000 g | INTRAVENOUS | Status: AC
  Administered 2024-02-02: 2 g via INTRAVENOUS

## 2024-02-02 ENCOUNTER — Ambulatory Visit
Admission: RE | Admit: 2024-02-02 | Discharge: 2024-02-02 | Disposition: A | Discharge status: Home / Self Care | Attending: General Surgery | Admitting: General Surgery

## 2024-02-02 ENCOUNTER — Ambulatory Visit: Admitting: Certified Registered"

## 2024-02-02 ENCOUNTER — Encounter
Admission: RE | Disposition: A | Discharge status: Home / Self Care | Payer: Self-pay | Source: Home / Self Care | Attending: General Surgery

## 2024-02-02 ENCOUNTER — Other Ambulatory Visit: Payer: Self-pay

## 2024-02-02 ENCOUNTER — Encounter: Payer: Self-pay | Admitting: General Surgery

## 2024-02-02 DIAGNOSIS — K420 Umbilical hernia with obstruction, without gangrene: Secondary | ICD-10-CM | POA: Diagnosis present

## 2024-02-02 DIAGNOSIS — E66813 Obesity, class 3: Secondary | ICD-10-CM | POA: Insufficient documentation

## 2024-02-02 DIAGNOSIS — Z6837 Body mass index (BMI) 37.0-37.9, adult: Secondary | ICD-10-CM | POA: Diagnosis not present

## 2024-02-02 SURGERY — REPAIR, HERNIA, UMBILICAL, ROBOT-ASSISTED
Anesthesia: General | Site: Abdomen

## 2024-02-02 MED ORDER — HYDROMORPHONE HCL 1 MG/ML IJ SOLN
INTRAMUSCULAR | Status: DC | PRN
  Administered 2024-02-02: 1 mg via INTRAVENOUS

## 2024-02-02 MED ORDER — OXYCODONE HCL 5 MG/5ML PO SOLN: 5.0000 mg | Freq: Once | ORAL | Status: AC | PRN

## 2024-02-02 MED ORDER — DROPERIDOL 2.5 MG/ML IJ SOLN: 0.6250 mg | Freq: Once | INTRAMUSCULAR | Status: DC | PRN

## 2024-02-02 MED ORDER — SUGAMMADEX SODIUM 200 MG/2ML IV SOLN
INTRAVENOUS | Status: DC | PRN
  Administered 2024-02-02: 300 mg via INTRAVENOUS

## 2024-02-02 MED ORDER — ACETAMINOPHEN 10 MG/ML IV SOLN
INTRAVENOUS | Status: DC | PRN
  Administered 2024-02-02: 1000 mg via INTRAVENOUS

## 2024-02-02 MED ORDER — SUCCINYLCHOLINE CHLORIDE 200 MG/10ML IV SOSY
PREFILLED_SYRINGE | INTRAVENOUS | Status: DC | PRN
  Administered 2024-02-02: 100 mg via INTRAVENOUS

## 2024-02-02 MED ORDER — FENTANYL CITRATE (PF) 100 MCG/2ML IJ SOLN
INTRAMUSCULAR | Status: AC
  Filled 2024-02-02: qty 2

## 2024-02-02 MED ORDER — CHLORHEXIDINE GLUCONATE 0.12 % MT SOLN
OROMUCOSAL | Status: AC
  Filled 2024-02-02: qty 15

## 2024-02-02 MED ORDER — MIDAZOLAM HCL 2 MG/2ML IJ SOLN
INTRAMUSCULAR | Status: AC
  Filled 2024-02-02: qty 2

## 2024-02-02 MED ORDER — HYDROCODONE-ACETAMINOPHEN 5-325 MG PO TABS
1.0000 | ORAL_TABLET | Freq: Four times a day (QID) | ORAL | 0 refills | Status: AC | PRN
  Filled 2024-02-02: qty 20, 5d supply, fill #0

## 2024-02-02 MED ORDER — ROCURONIUM BROMIDE 100 MG/10ML IV SOLN
INTRAVENOUS | Status: DC | PRN
  Administered 2024-02-02: 10 mg via INTRAVENOUS
  Administered 2024-02-02 (×2): 40 mg via INTRAVENOUS

## 2024-02-02 MED ORDER — FENTANYL CITRATE (PF) 100 MCG/2ML IJ SOLN
25.0000 ug | INTRAMUSCULAR | Status: DC | PRN
  Administered 2024-02-02 (×4): 25 ug via INTRAVENOUS

## 2024-02-02 MED ORDER — MIDAZOLAM HCL 2 MG/2ML IJ SOLN
INTRAMUSCULAR | Status: DC | PRN
  Administered 2024-02-02: 2 mg via INTRAVENOUS

## 2024-02-02 MED ORDER — PROPOFOL 10 MG/ML IV BOLUS
INTRAVENOUS | Status: DC | PRN
  Administered 2024-02-02: 200 mg via INTRAVENOUS

## 2024-02-02 MED ORDER — DEXAMETHASONE SODIUM PHOSPHATE 10 MG/ML IJ SOLN
INTRAMUSCULAR | Status: DC | PRN
  Administered 2024-02-02: 10 mg via INTRAVENOUS

## 2024-02-02 MED ORDER — CEFAZOLIN SODIUM-DEXTROSE 2-4 GM/100ML-% IV SOLN
INTRAVENOUS | Status: AC
  Filled 2024-02-02: qty 100

## 2024-02-02 MED ORDER — OXYCODONE HCL 5 MG PO TABS
5.0000 mg | ORAL_TABLET | Freq: Once | ORAL | Status: AC | PRN
  Administered 2024-02-02: 5 mg via ORAL

## 2024-02-02 MED ORDER — FENTANYL CITRATE (PF) 100 MCG/2ML IJ SOLN
INTRAMUSCULAR | Status: DC | PRN
  Administered 2024-02-02: 50 ug via INTRAVENOUS

## 2024-02-02 MED ORDER — ONDANSETRON HCL 4 MG/2ML IJ SOLN
INTRAMUSCULAR | Status: DC | PRN
  Administered 2024-02-02: 4 mg via INTRAVENOUS

## 2024-02-02 MED ORDER — ACETAMINOPHEN 10 MG/ML IV SOLN
INTRAVENOUS | Status: AC
  Filled 2024-02-02: qty 100

## 2024-02-02 MED ORDER — 0.9 % SODIUM CHLORIDE (POUR BTL) OPTIME
TOPICAL | Status: DC | PRN
  Administered 2024-02-02: 500 mL

## 2024-02-02 MED ORDER — ACETAMINOPHEN 10 MG/ML IV SOLN: 1000.0000 mg | Freq: Once | INTRAVENOUS | Status: DC | PRN

## 2024-02-02 MED ORDER — HYDROMORPHONE HCL 1 MG/ML IJ SOLN
INTRAMUSCULAR | Status: AC
  Filled 2024-02-02: qty 1

## 2024-02-02 MED ORDER — BUPIVACAINE-EPINEPHRINE (PF) 0.5% -1:200000 IJ SOLN
INTRAMUSCULAR | Status: AC
  Filled 2024-02-02: qty 30

## 2024-02-02 MED ORDER — GLYCOPYRROLATE 0.2 MG/ML IJ SOLN
INTRAMUSCULAR | Status: DC | PRN
  Administered 2024-02-02 (×2): .2 mg via INTRAVENOUS

## 2024-02-02 MED ORDER — PHENYLEPHRINE HCL-NACL 20-0.9 MG/250ML-% IV SOLN
INTRAVENOUS | Status: DC | PRN
  Administered 2024-02-02: 25 ug/min via INTRAVENOUS

## 2024-02-02 MED ORDER — BUPIVACAINE-EPINEPHRINE (PF) 0.25% -1:200000 IJ SOLN
INTRAMUSCULAR | Status: DC | PRN
  Administered 2024-02-02: 15 mL via PERINEURAL

## 2024-02-02 MED ORDER — VASOPRESSIN 20 UNIT/ML IV SOLN
INTRAVENOUS | Status: DC | PRN
Start: 2024-02-02 — End: 2024-02-02
  Administered 2024-02-02: 4 [IU] via INTRAVENOUS
  Administered 2024-02-02: 2 [IU] via INTRAVENOUS

## 2024-02-02 MED ORDER — EPHEDRINE SULFATE-NACL 50-0.9 MG/10ML-% IV SOSY
PREFILLED_SYRINGE | INTRAVENOUS | Status: DC | PRN
  Administered 2024-02-02: 10 mg via INTRAVENOUS

## 2024-02-02 MED ORDER — LIDOCAINE HCL (CARDIAC) PF 100 MG/5ML IV SOSY
PREFILLED_SYRINGE | INTRAVENOUS | Status: DC | PRN
  Administered 2024-02-02: 100 mg via INTRAVENOUS

## 2024-02-02 MED ORDER — OXYCODONE HCL 5 MG PO TABS
ORAL_TABLET | ORAL | Status: AC
  Filled 2024-02-02: qty 1

## 2024-02-02 SURGICAL SUPPLY — 41 items
BAG PRESSURE INF REUSE 1000 (BAG) IMPLANT
COVER TIP SHEARS 8 DVNC (MISCELLANEOUS) ×1 IMPLANT
COVER WAND RF STERILE (DRAPES) ×1 IMPLANT
DEFOGGER SCOPE WARM SEASHARP (MISCELLANEOUS) ×1 IMPLANT
DERMABOND ADVANCED .7 DNX12 (GAUZE/BANDAGES/DRESSINGS) ×1 IMPLANT
DRAPE ARM DVNC X/XI (DISPOSABLE) ×3 IMPLANT
DRAPE COLUMN DVNC XI (DISPOSABLE) ×1 IMPLANT
ELECTRODE REM PT RTRN 9FT ADLT (ELECTROSURGICAL) ×1 IMPLANT
FORCEPS BPLR FENES DVNC XI (FORCEP) ×1 IMPLANT
GLOVE BIO SURGEON STRL SZ 6.5 (GLOVE) ×2 IMPLANT
GLOVE BIOGEL PI IND STRL 6.5 (GLOVE) ×2 IMPLANT
GLOVE SURG SYN 6.5 PF PI (GLOVE) ×2 IMPLANT
GOWN STRL REUS W/ TWL LRG LVL3 (GOWN DISPOSABLE) ×4 IMPLANT
IRRIGATOR SUCT 8 DISP DVNC XI (IRRIGATION / IRRIGATOR) IMPLANT
IV 0.9% NACL 1000 ML (IV SOLUTION) IMPLANT
IV CATH ANGIO 12GX3 LT BLUE (NEEDLE) IMPLANT
KIT PINK PAD W/HEAD ARM REST (MISCELLANEOUS) ×1 IMPLANT
LABEL OR SOLS (LABEL) ×1 IMPLANT
MANIFOLD NEPTUNE II (INSTRUMENTS) ×1 IMPLANT
MESH PROGRIP HERNIA FLAT 15X15 (Mesh General) IMPLANT
NDL DRIVE SUT CUT DVNC (INSTRUMENTS) ×1 IMPLANT
NDL HYPO 22X1.5 SAFETY MO (MISCELLANEOUS) ×1 IMPLANT
NDL INSUFFLATION 14GA 120MM (NEEDLE) ×1 IMPLANT
NEEDLE DRIVE SUT CUT DVNC (INSTRUMENTS) ×1 IMPLANT
NEEDLE HYPO 22X1.5 SAFETY MO (MISCELLANEOUS) ×1 IMPLANT
NEEDLE INSUFFLATION 14GA 120MM (NEEDLE) ×1 IMPLANT
NS IRRIG 500ML POUR BTL (IV SOLUTION) ×1 IMPLANT
OBTURATOR OPTICALSTD 8 DVNC (TROCAR) ×1 IMPLANT
PACK LAP CHOLECYSTECTOMY (MISCELLANEOUS) ×1 IMPLANT
SCISSORS MNPLR CVD DVNC XI (INSTRUMENTS) ×1 IMPLANT
SEAL UNIV 5-12 XI (MISCELLANEOUS) ×3 IMPLANT
SET TUBE SMOKE EVAC HIGH FLOW (TUBING) ×1 IMPLANT
SOLUTION ELECTROSURG ANTI STCK (MISCELLANEOUS) ×1 IMPLANT
SUT STRATA 2-0 30 CT-2 (SUTURE) ×2 IMPLANT
SUT STRATAFIX PDS 30 CT-1 (SUTURE) ×1 IMPLANT
SUT VIC AB 3-0 SH 27X BRD (SUTURE) IMPLANT
SUT VICRYL 0 UR6 27IN ABS (SUTURE) ×1 IMPLANT
SUTURE MNCRL 4-0 27XMF (SUTURE) ×1 IMPLANT
TAPE TRANSPORE STRL 2 31045 (GAUZE/BANDAGES/DRESSINGS) ×1 IMPLANT
TRAP FLUID SMOKE EVACUATOR (MISCELLANEOUS) ×1 IMPLANT
WATER STERILE IRR 500ML POUR (IV SOLUTION) ×1 IMPLANT

## 2024-02-02 NOTE — Discharge Instructions (Signed)

## 2024-02-02 NOTE — Anesthesia Preprocedure Evaluation (Signed)
 Anesthesia Evaluation  Patient identified by MRN, date of birth, ID band Patient awake    Reviewed: Allergy & Precautions, H&P , NPO status , Patient's Chart, lab work & pertinent test results, reviewed documented beta blocker date and time   Airway Mallampati: II  TM Distance: >3 FB Neck ROM: full    Dental  (+) Teeth Intact   Pulmonary neg pulmonary ROS   Pulmonary exam normal        Cardiovascular Exercise Tolerance: Good negative cardio ROS Normal cardiovascular exam Rhythm:regular Rate:Normal     Neuro/Psych negative neurological ROS  negative psych ROS   GI/Hepatic negative GI ROS, Neg liver ROS,,,  Endo/Other    Class 3 obesity  Renal/GU negative Renal ROS  negative genitourinary   Musculoskeletal   Abdominal   Peds  Hematology negative hematology ROS (+)   Anesthesia Other Findings Past Medical History: No date: Medical history non-contributory No date: Pre-diabetes Past Surgical History: 08/28/2020: COLONOSCOPY WITH PROPOFOL ; N/A     Comment:  Procedure: COLONOSCOPY WITH PROPOFOL ;  Surgeon:               Maryruth Ole DASEN, MD;  Location: [facility] ENDOSCOPY;                Service: Endoscopy;  Laterality: N/A;  Spanish               Interpreter 10/23/2022: INSERTION OF MESH     Comment:  Procedure: INSERTION OF MESH;  Surgeon: Rodolph Romano, MD;  Location: [facility] ORS;  Service: General;; 2011: LIPOMA EXCISION     Comment:  back lipoma surgery BMI    Body Mass Index: 36.88 kg/m     Reproductive/Obstetrics negative OB ROS                              Anesthesia Physical Anesthesia Plan  ASA: 3  Anesthesia Plan: General ETT   Post-op Pain Management:    Induction:   PONV Risk Score and Plan: 3  Airway Management Planned:   Additional Equipment:   Intra-op Plan:   Post-operative Plan:   Informed Consent: I have reviewed the patients  History and Physical, chart, labs and discussed the procedure including the risks, benefits and alternatives for the proposed anesthesia with the patient or authorized representative who has indicated his/her understanding and acceptance.     Dental Advisory Given  Plan Discussed with: CRNA  Anesthesia Plan Comments:         Anesthesia Quick Evaluation

## 2024-02-02 NOTE — Interval H&P Note (Signed)
 History and Physical Interval Note:  02/02/2024 10:07 AM  Jon Decker  has presented today for surgery, with the diagnosis of K42.9 umbilical hernia w/o obstruction or gangrene.  The various methods of treatment have been discussed with the patient and family. After consideration of risks, benefits and other options for treatment, the patient has consented to  Procedure(s): REPAIR, HERNIA, UMBILICAL, ROBOT-ASSISTED (N/A) as a surgical intervention.  The patient's history has been reviewed, patient examined, no change in status, stable for surgery.  I have reviewed the patient's chart and labs.  Questions were answered to the patient's satisfaction.     Lucas Sjogren

## 2024-02-02 NOTE — Op Note (Signed)
 Preoperative diagnosis: Umbilical Hernia   Postoperative diagnosis: Umbilical Hernia   Procedure: Robotic assisted laparoscopic transabdominal pre peritoneal umbilical hernia repair with mesh  Anesthesia: General   Surgeon: Lucas Sjogren, MD, FACS   Wound Classification: Clean   Specimen: None   Complications: None   Estimated Blood Loss: 5 mL   Indications: A 48 year old male with symptomatic, incarcerated umbilical hernia. Repair indicated to improve pain and avoid complications such as incarceration or strangulation.    Findings: 3 cm incarcerated umbilical hernia  2. Tension free repair achieved with 13 cm x 13 cm Progrip mesh  3. Adequate hemostasis     Description of procedure: The patient was brought to the operating room and general anesthesia was induced. A time-out was completed verifying correct patient, procedure, site, positioning, and implant(s) and/or special equipment prior to beginning this procedure. Antibiotics were administered prior to making the incision. SCDs placed. The anterior abdominal wall was prepped and draped in the standard sterile fashion.    Palmer's point chosen for entry.  Veress needle placed and abdomen insufflated to 15 mmHg without any dramatic increase in pressure.  Needle removed and optiview technique used to place 8 mm port at same point.  No injury noted during placement. Two additional ports were placed along left lateral aspect.  Xi robot then docked into place.     A pre peritoneal flap was started 6 cm lateral to the umbilical defect. The pre peritoneal flap was extended 6 cm away from the hernia defect in all directions. The hernia sac and content were reduced.    Insufflation dropped to 8 mm and transfacial suture with 0 stratafix used to primarily close defect under minimal tension. Progrip 13 cm x 13 cm  mesh was placed within the the pre peritoneal flap and self attached to the anterior abdominal wall centered over the  defect.  The pre peritoneal flap was closed using 2-0 Stratafix.  Any peritoneal defect was closed with 3-0 Vicryl.     Robot was undocked.  Abdomen then desufflated while camera within abdomen to ensure no signs of new bleed prior to removing camera and rest of ports completely.  All skin incisions closed with 4-0 Monocryl in a subcuticular fashion.  All wounds then dressed with Dermabond.   Patient was then successfully awakened and transferred to PACU in stable condition.  At the end of the procedure sponge and instrument counts were correct.

## 2024-02-02 NOTE — Transfer of Care (Signed)
 Immediate Anesthesia Transfer of Care Note  Patient: Jon Decker  Procedure(s) Performed: REPAIR, HERNIA, UMBILICAL, ROBOT-ASSISTED WITH MESH (Abdomen)  Patient Location: PACU  Anesthesia Type:General  Level of Consciousness: drowsy  Airway & Oxygen Therapy: Patient Spontanous Breathing and Patient connected to face mask oxygen  Post-op Assessment: Report given to RN  Post vital signs: stable  Last Vitals:  Vitals Value Taken Time  BP 127/88 02/02/24 12:30  Temp    Pulse 92 02/02/24 12:33  Resp 16 02/02/24 12:33  SpO2 100 % 02/02/24 12:33  Vitals shown include unfiled device data.  Last Pain:  Vitals:   02/02/24 0934  TempSrc: Temporal  PainSc: 0-No pain      Patients Stated Pain Goal: 0 (02/02/24 0934)  Complications: No notable events documented.

## 2024-02-02 NOTE — Anesthesia Procedure Notes (Signed)
 Procedure Name: Intubation Date/Time: 02/02/2024 11:24 AM  Performed by: Ledora Duncan, CRNAPre-anesthesia Checklist: Patient identified, Emergency Drugs available, Suction available and Patient being monitored Patient Re-evaluated:Patient Re-evaluated prior to induction Oxygen Delivery Method: Circle system utilized Preoxygenation: Pre-oxygenation with 100% oxygen Induction Type: IV induction Ventilation: Mask ventilation without difficulty Laryngoscope Size: McGrath and 3 Grade View: Grade I Tube type: Oral Tube size: 7.0 mm Number of attempts: 1 Airway Equipment and Method: Stylet and Oral airway Placement Confirmation: ETT inserted through vocal cords under direct vision, positive ETCO2 and breath sounds checked- equal and bilateral Secured at: 21 cm Tube secured with: Tape Dental Injury: Teeth and Oropharynx as per pre-operative assessment

## 2024-02-03 NOTE — Anesthesia Postprocedure Evaluation (Signed)
 Anesthesia Post Note  Patient: Jon Decker  Procedure(s) Performed: REPAIR, HERNIA, UMBILICAL, ROBOT-ASSISTED WITH MESH (Abdomen)  Patient location during evaluation: PACU Anesthesia Type: General Level of consciousness: awake and alert Pain management: pain level controlled Vital Signs Assessment: post-procedure vital signs reviewed and stable Respiratory status: spontaneous breathing, nonlabored ventilation, respiratory function stable and patient connected to nasal cannula oxygen Cardiovascular status: blood pressure returned to baseline and stable Postop Assessment: no apparent nausea or vomiting Anesthetic complications: no   No notable events documented.   Last Vitals:  Vitals:   02/02/24 1245 02/02/24 1406  BP: 129/89 (!) 126/92  Pulse: 95 98  Resp: 13 16  Temp:  (!) 36.3 C  SpO2: 95% 98%    Last Pain:  Vitals:   02/02/24 1406  TempSrc: Temporal  PainSc: 4                  Lynwood KANDICE Clause

## 2024-05-17 ENCOUNTER — Encounter
Admission: RE | Disposition: A | Discharge status: Home / Self Care | Payer: Self-pay | Source: Home / Self Care | Attending: Gastroenterology

## 2024-05-17 ENCOUNTER — Ambulatory Visit: Admitting: Anesthesiology

## 2024-05-17 ENCOUNTER — Ambulatory Visit
Admission: RE | Admit: 2024-05-17 | Discharge: 2024-05-17 | Disposition: A | Discharge status: Home / Self Care | Attending: Gastroenterology | Admitting: Gastroenterology

## 2024-05-17 ENCOUNTER — Encounter: Payer: Self-pay | Admitting: Gastroenterology

## 2024-05-17 DIAGNOSIS — D122 Benign neoplasm of ascending colon: Secondary | ICD-10-CM | POA: Diagnosis not present

## 2024-05-17 DIAGNOSIS — K640 First degree hemorrhoids: Secondary | ICD-10-CM | POA: Insufficient documentation

## 2024-05-17 DIAGNOSIS — Z09 Encounter for follow-up examination after completed treatment for conditions other than malignant neoplasm: Secondary | ICD-10-CM | POA: Diagnosis present

## 2024-05-17 HISTORY — PX: COLONOSCOPY: SHX5424

## 2024-05-17 HISTORY — PX: POLYPECTOMY: SHX149

## 2024-05-17 SURGERY — COLONOSCOPY
Anesthesia: General

## 2024-05-17 MED ORDER — LIDOCAINE HCL (CARDIAC) PF 100 MG/5ML IV SOSY
PREFILLED_SYRINGE | INTRAVENOUS | Status: DC | PRN
  Administered 2024-05-17: 80 mg via INTRAVENOUS

## 2024-05-17 MED ORDER — SODIUM CHLORIDE 0.9 % IV SOLN
INTRAVENOUS | Status: DC
  Administered 2024-05-17: 20 mL/h via INTRAVENOUS

## 2024-05-17 MED ORDER — PROPOFOL 500 MG/50ML IV EMUL
INTRAVENOUS | Status: DC | PRN
  Administered 2024-05-17: 75 ug/kg/min via INTRAVENOUS

## 2024-05-17 MED ORDER — LIDOCAINE HCL (PF) 2 % IJ SOLN
INTRAMUSCULAR | Status: AC
  Filled 2024-05-17: qty 5

## 2024-05-17 MED ORDER — PROPOFOL 10 MG/ML IV BOLUS
INTRAVENOUS | Status: DC | PRN
  Administered 2024-05-17 (×2): 50 mg via INTRAVENOUS

## 2024-05-17 MED ORDER — PHENYLEPHRINE 80 MCG/ML (10ML) SYRINGE FOR IV PUSH (FOR BLOOD PRESSURE SUPPORT)
PREFILLED_SYRINGE | INTRAVENOUS | Status: DC | PRN
  Administered 2024-05-17: 160 ug via INTRAVENOUS

## 2024-05-17 MED ORDER — DEXMEDETOMIDINE HCL IN NACL 80 MCG/20ML IV SOLN
INTRAVENOUS | Status: DC | PRN
  Administered 2024-05-17: 8 ug via INTRAVENOUS
  Administered 2024-05-17: 12 ug via INTRAVENOUS

## 2024-05-17 NOTE — Interval H&P Note (Signed)
 History and Physical Interval Note:  05/17/2024 10:21 AM  Jon Decker  has presented today for surgery, with the diagnosis of History of adenomatous polyp of colon (Z86.0101).  The various methods of treatment have been discussed with the patient and family. After consideration of risks, benefits and other options for treatment, the patient has consented to  Procedures with comments: COLONOSCOPY (N/A) - SPANISH INTERPRETER as a surgical intervention.  The patient's history has been reviewed, patient examined, no change in status, stable for surgery.  I have reviewed the patient's chart and labs.  Questions were answered to the patient's satisfaction.     Jon Decker  Ok to proceed with colonoscopy

## 2024-05-17 NOTE — Anesthesia Preprocedure Evaluation (Signed)
 Anesthesia Evaluation  Patient identified by MRN, date of birth, ID band Patient awake    Reviewed: Allergy & Precautions, H&P , NPO status , Patient's Chart, lab work & pertinent test results  Airway Mallampati: II  TM Distance: >3 FB Neck ROM: Full    Dental no notable dental hx.    Pulmonary neg pulmonary ROS   Pulmonary exam normal breath sounds clear to auscultation       Cardiovascular negative cardio ROS Normal cardiovascular exam Rhythm:Regular Rate:Normal     Neuro/Psych negative neurological ROS  negative psych ROS   GI/Hepatic negative GI ROS, Neg liver ROS,,,  Endo/Other  negative endocrine ROS    Renal/GU negative Renal ROS  negative genitourinary   Musculoskeletal negative musculoskeletal ROS (+)    Abdominal   Peds negative pediatric ROS (+)  Hematology negative hematology ROS (+)   Anesthesia Other Findings   Reproductive/Obstetrics negative OB ROS                             Anesthesia Physical Anesthesia Plan  ASA: 2  Anesthesia Plan: General   Post-op Pain Management:    Induction: Intravenous  PONV Risk Score and Plan:   Airway Management Planned:   Additional Equipment:   Intra-op Plan:   Post-operative Plan: Extubation in OR  Informed Consent: I have reviewed the patients History and Physical, chart, labs and discussed the procedure including the risks, benefits and alternatives for the proposed anesthesia with the patient or authorized representative who has indicated his/her understanding and acceptance.     Dental advisory given  Plan Discussed with: CRNA  Anesthesia Plan Comments:        Anesthesia Quick Evaluation

## 2024-05-17 NOTE — H&P (Signed)
 Outpatient short stay form Pre-procedure 05/17/2024  Jon ONEIDA Schick, MD  Primary Physician: Alla Amis, MD  Reason for visit:  Surveillance  History of present illness:    49 y/o gentleman with history of multiple Ta's on last colonoscopy about 3 years ago. No blood thinners. No family history of GI malignancies. History of umbilical hernia surgery.   Current Medications[1]  Medications Prior to Admission  Medication Sig Dispense Refill Last Dose/Taking   Multiple Vitamin (MULTIVITAMIN WITH MINERALS) TABS tablet Take 1 tablet by mouth daily with lunch.   Past Week     Allergies[2]   Past Medical History:  Diagnosis Date   Medical history non-contributory    Pre-diabetes     Review of systems:  Otherwise negative.    Physical Exam  Gen: Alert, oriented. Appears stated age.  HEENT: PERRLA. Lungs: No respiratory distress CV: RRR Abd: soft, benign, no masses Ext: No edema    Planned procedures: Proceed with colonoscopy. The patient understands the nature of the planned procedure, indications, risks, alternatives and potential complications including but not limited to bleeding, infection, perforation, damage to internal organs and possible oversedation/side effects from anesthesia. The patient agrees and gives consent to proceed.  Please refer to procedure notes for findings, recommendations and patient disposition/instructions.     Jon ONEIDA Schick, MD [facility]         [1]  Current Facility-Administered Medications:    0.9 %  sodium chloride  infusion, , Intravenous, Continuous, Tinsleigh Slovacek, Jon ONEIDA, MD, Last Rate: 20 mL/hr at 05/17/24 0958, Continued from Pre-op at 05/17/24 0958 [2] No Known Allergies

## 2024-05-17 NOTE — Transfer of Care (Signed)
 Immediate Anesthesia Transfer of Care Note  Patient: Jon Decker  Procedure(s) Performed: COLONOSCOPY POLYPECTOMY, INTESTINE  Patient Location: PACU  Anesthesia Type:General  Level of Consciousness: sedated  Airway & Oxygen Therapy: Patient Spontanous Breathing  Post-op Assessment: Report given to RN and Post -op Vital signs reviewed and stable  Post vital signs: Reviewed and stable  Last Vitals:  Vitals Value Taken Time  BP    Temp    Pulse    Resp    SpO2      Last Pain:  Vitals:   05/17/24 0926  TempSrc: Temporal  PainSc: 0-No pain         Complications: No notable events documented.

## 2024-05-17 NOTE — Anesthesia Postprocedure Evaluation (Signed)
"   Anesthesia Post Note  Patient: Jon Decker  Procedure(s) Performed: COLONOSCOPY POLYPECTOMY, INTESTINE  Patient location during evaluation: PACU Anesthesia Type: General Level of consciousness: awake and alert Pain management: pain level controlled Vital Signs Assessment: post-procedure vital signs reviewed and stable Respiratory status: spontaneous breathing, nonlabored ventilation, respiratory function stable and patient connected to nasal cannula oxygen Cardiovascular status: blood pressure returned to baseline and stable Postop Assessment: no apparent nausea or vomiting Anesthetic complications: no   No notable events documented.   Last Vitals:  Vitals:   05/17/24 0926 05/17/24 1045  BP: (!) 132/90 110/71  Pulse: 86 69  Resp: 20 16  Temp: (!) 35.7 C (!) 35.8 C  SpO2: 99% 99%    Last Pain:  Vitals:   05/17/24 1045  TempSrc: Tympanic  PainSc: Asleep                 [name]      "

## 2024-05-17 NOTE — Op Note (Signed)
 [facility] Patient Name: Dilan Novosad Procedure Date: 05/17/2024 10:16 AM MRN: [id number] Account #: [id number] Date of Birth: [birth year] Admit Type: Outpatient Age: 49 Room: [facility] ENDO ROOM 3 Gender: Male Note Status: Finalized Instrument Name: Colon Scope [id number] Procedure:             Colonoscopy Indications:           Surveillance: Personal history of adenomatous polyps                         on last colonoscopy 3 years ago Providers:             Ole Schick MD, MD Referring MD:          Alda Carpen (Referring MD) Medicines:             Monitored Anesthesia Care Complications:         No immediate complications. Estimated blood loss:                         Minimal. Procedure:             Pre-Anesthesia Assessment:                        - Prior to the procedure, a History and Physical was                         performed, and patient medications and allergies were                         reviewed. The patient is competent. The risks and                         benefits of the procedure and the sedation options and                         risks were discussed with the patient. All questions                         were answered and informed consent was obtained.                         Patient identification and proposed procedure were                         verified by the physician, the nurse, the                         anesthesiologist, the anesthetist and the technician                         in the endoscopy suite. Mental Status Examination:                         alert and oriented. Airway Examination: normal                         oropharyngeal airway and neck mobility. Respiratory  Examination: clear to auscultation. CV Examination:                         normal. Prophylactic Antibiotics: The patient does not                         require prophylactic antibiotics. Prior                          Anticoagulants: The patient has taken no anticoagulant                         or antiplatelet agents. ASA Grade Assessment: II - A                         patient with mild systemic disease. After reviewing                         the risks and benefits, the patient was deemed in                         satisfactory condition to undergo the procedure. The                         anesthesia plan was to use monitored anesthesia care                         (MAC). Immediately prior to administration of                         medications, the patient was re-assessed for adequacy                         to receive sedatives. The heart rate, respiratory                         rate, oxygen saturations, blood pressure, adequacy of                         pulmonary ventilation, and response to care were                         monitored throughout the procedure. The physical                         status of the patient was re-assessed after the                         procedure.                        After obtaining informed consent, the colonoscope was                         passed under direct vision. Throughout the procedure,                         the patient's blood pressure, pulse, and oxygen  saturations were monitored continuously. The                         Colonoscope was introduced through the anus and                         advanced to the the terminal ileum, with                         identification of the appendiceal orifice and IC                         valve. The colonoscopy was performed without                         difficulty. The patient tolerated the procedure well.                         The quality of the bowel preparation was good. The                         terminal ileum, ileocecal valve, appendiceal orifice,                         and rectum were photographed. Findings:      The perianal and digital rectal examinations  were normal.      The terminal ileum appeared normal.      Three sessile polyps were found in the ascending colon. The polyps were       3 to 4 mm in size. These polyps were removed with a cold snare.       Resection and retrieval were complete. Estimated blood loss was minimal.      Internal hemorrhoids were found during retroflexion. The hemorrhoids       were Grade I (internal hemorrhoids that do not prolapse).      The exam was otherwise without abnormality on direct and retroflexion       views. Impression:            - The examined portion of the ileum was normal.                        - Three 3 to 4 mm polyps in the ascending colon,                         removed with a cold snare. Resected and retrieved.                        - Internal hemorrhoids.                        - The examination was otherwise normal on direct and                         retroflexion views. Recommendation:        - Discharge patient to home.                        - Resume previous diet.                        -  Continue present medications.                        - Await pathology results.                        - Repeat colonoscopy in 5 years for surveillance.                        - Return to referring physician as previously                         scheduled. Procedure Code(s):     --- Professional ---                        [phone], Colonoscopy, flexible; with removal of                         tumor(s), polyp(s), or other lesion(s) by snare                         technique Diagnosis Code(s):     --- Professional ---                        Z86.010, Personal history of colonic polyps                        K64.0, First degree hemorrhoids                        D12.2, Benign neoplasm of ascending colon CPT copyright 2022 [employer]. All rights reserved. The codes documented in this report are preliminary and upon coder review may  be revised to meet current compliance  requirements. Ole Schick MD, MD 05/17/2024 10:48:41 AM Number of Addenda: 0 Note Initiated On: 05/17/2024 10:16 AM Scope Withdrawal Time: 0 hours 9 minutes 39 seconds  Total Procedure Duration: 0 hours 12 minutes 15 seconds  Estimated Blood Loss:  Estimated blood loss was minimal.      [place]

## 2024-05-18 LAB — SURGICAL PATHOLOGY
